# Patient Record
Sex: Male | Born: 1960 | Race: White | Hispanic: No | Marital: Married | Smoking: Never smoker
Health system: Southern US, Community
[De-identification: ages and names within clinical notes are randomized; demographics above are authoritative.]

## PROBLEM LIST (undated history)

## (undated) DIAGNOSIS — C801 Malignant (primary) neoplasm, unspecified: Secondary | ICD-10-CM

## (undated) HISTORY — PX: NO PAST SURGERIES: SHX2092

## (undated) HISTORY — PX: WRIST SURGERY: SHX841

---

## 2001-10-21 ENCOUNTER — Ambulatory Visit (HOSPITAL_COMMUNITY): Admission: RE | Admit: 2001-10-21 | Discharge: 2001-10-21 | Payer: Self-pay | Admitting: Chiropractic Medicine

## 2001-10-21 ENCOUNTER — Encounter: Payer: Self-pay | Admitting: Chiropractic Medicine

## 2003-07-19 ENCOUNTER — Ambulatory Visit (HOSPITAL_COMMUNITY): Admission: RE | Admit: 2003-07-19 | Discharge: 2003-07-19 | Payer: Self-pay | Admitting: Family Medicine

## 2003-09-14 ENCOUNTER — Ambulatory Visit (HOSPITAL_COMMUNITY): Admission: RE | Admit: 2003-09-14 | Discharge: 2003-09-14 | Payer: Self-pay | Admitting: Neurology

## 2016-02-22 ENCOUNTER — Other Ambulatory Visit (HOSPITAL_COMMUNITY): Payer: Self-pay | Admitting: Urology

## 2016-02-22 DIAGNOSIS — C61 Malignant neoplasm of prostate: Secondary | ICD-10-CM

## 2016-04-12 ENCOUNTER — Ambulatory Visit (HOSPITAL_COMMUNITY)
Admission: RE | Admit: 2016-04-12 | Discharge: 2016-04-12 | Disposition: A | Discharge status: Home / Self Care | Payer: BLUE CROSS/BLUE SHIELD | Source: Ambulatory Visit | Attending: Urology | Admitting: Urology

## 2016-04-12 DIAGNOSIS — C61 Malignant neoplasm of prostate: Secondary | ICD-10-CM | POA: Diagnosis present

## 2016-04-12 MED ORDER — GADOBENATE DIMEGLUMINE 529 MG/ML IV SOLN
20.0000 mL | Freq: Once | INTRAVENOUS | Status: AC | PRN
  Administered 2016-04-12: 17 mL via INTRAVENOUS

## 2016-06-28 DIAGNOSIS — C61 Malignant neoplasm of prostate: Secondary | ICD-10-CM | POA: Diagnosis not present

## 2016-07-22 DIAGNOSIS — M6281 Muscle weakness (generalized): Secondary | ICD-10-CM | POA: Diagnosis not present

## 2016-07-22 DIAGNOSIS — C61 Malignant neoplasm of prostate: Secondary | ICD-10-CM | POA: Diagnosis not present

## 2016-07-31 DIAGNOSIS — M6281 Muscle weakness (generalized): Secondary | ICD-10-CM | POA: Diagnosis not present

## 2016-07-31 DIAGNOSIS — M62838 Other muscle spasm: Secondary | ICD-10-CM | POA: Diagnosis not present

## 2016-08-05 ENCOUNTER — Other Ambulatory Visit: Payer: Self-pay | Admitting: Urology

## 2016-08-21 DIAGNOSIS — M62838 Other muscle spasm: Secondary | ICD-10-CM | POA: Diagnosis not present

## 2016-08-21 DIAGNOSIS — M6281 Muscle weakness (generalized): Secondary | ICD-10-CM | POA: Diagnosis not present

## 2016-09-02 DIAGNOSIS — Z0000 Encounter for general adult medical examination without abnormal findings: Secondary | ICD-10-CM | POA: Diagnosis not present

## 2016-10-02 DIAGNOSIS — C61 Malignant neoplasm of prostate: Secondary | ICD-10-CM | POA: Diagnosis not present

## 2016-10-02 DIAGNOSIS — M6281 Muscle weakness (generalized): Secondary | ICD-10-CM | POA: Diagnosis not present

## 2016-10-02 DIAGNOSIS — M62838 Other muscle spasm: Secondary | ICD-10-CM | POA: Diagnosis not present

## 2016-10-08 DIAGNOSIS — D1801 Hemangioma of skin and subcutaneous tissue: Secondary | ICD-10-CM | POA: Diagnosis not present

## 2016-10-08 DIAGNOSIS — D485 Neoplasm of uncertain behavior of skin: Secondary | ICD-10-CM | POA: Diagnosis not present

## 2016-10-08 DIAGNOSIS — Z85828 Personal history of other malignant neoplasm of skin: Secondary | ICD-10-CM | POA: Diagnosis not present

## 2016-10-08 DIAGNOSIS — C44112 Basal cell carcinoma of skin of right eyelid, including canthus: Secondary | ICD-10-CM | POA: Diagnosis not present

## 2016-10-08 NOTE — Patient Instructions (Addendum)
Joshua Lloyd  10/08/2016   Your procedure is scheduled on: 10/17/16  Report to [facility] Main  Entrance Take [place]  elevators to 3rd floor to  [place] at       [phone].     Call this number if you have problems the morning of surgery [phone]    Remember: ONLY 1 PERSON MAY GO WITH YOU TO SHORT STAY TO GET  READY MORNING OF YOUR SURGERY.     CLEAR LIQUID DIET   Day before surgery and a bottle of magnesium citrate by noon day before surgery, And a fleets enema night before surgery   Foods Allowed                                                                     Foods Excluded  Coffee and tea, regular and decaf                             liquids that you cannot  Plain Jell-O in any flavor                                             see through such as: Fruit ices (not with fruit pulp)                                     milk, soups, orange juice  Iced Popsicles                                    All solid food Carbonated beverages, regular and diet                                    Cranberry, grape and apple juices Sports drinks like Gatorade Lightly seasoned clear broth or consume(fat free) Sugar, honey syrup  Sample Menu Breakfast                                Lunch                                     Supper Cranberry juice                    Beef broth                            Chicken broth Jell-O                                     Grape  juice                           Apple juice Coffee or tea                        Jell-O                                      Popsicle                                                Coffee or tea                        Coffee or tea  _____________________________________________________________________      Do not drink liquids :After Midnight.     Take these medicines the morning of surgery with A SIP OF WATER:                                 You may not have any metal on your body  including hair pins and              piercings  Do not wear jewelry, lotions, powders or perfumes, deodorant                   Men may shave face and neck.   Do not bring valuables to the hospital. [place].  Contacts, dentures or bridgework may not be worn into surgery.  Leave suitcase in the car. After surgery it may be brought to your room.                  Please read over the following fact sheets you were given: _____________________________________________________________________             [facility] - Preparing for Surgery Before surgery, you can play an important role.  Because skin is not sterile, your skin needs to be as free of germs as possible.  You can reduce the number of germs on your skin by washing with CHG (chlorahexidine gluconate) soap before surgery.  CHG is an antiseptic cleaner which kills germs and bonds with the skin to continue killing germs even after washing. Please DO NOT use if you have an allergy to CHG or antibacterial soaps.  If your skin becomes reddened/irritated stop using the CHG and inform your nurse when you arrive at Short Stay. Do not shave (including legs and underarms) for at least 48 hours prior to the first CHG shower.  You may shave your face/neck. Please follow these instructions carefully:  1.  Shower with CHG Soap the night before surgery and the  morning of Surgery.  2.  If you choose to wash your hair, wash your hair first as usual with your  normal  shampoo.  3.  After you shampoo, rinse your hair and body thoroughly to remove the  shampoo.  4.  Use CHG as you would any other liquid soap.  You can apply chg directly  to the skin and wash                       Gently with a scrungie or clean washcloth.  5.  Apply the CHG Soap to your body ONLY FROM THE NECK DOWN.   Do not use on face/ open                           Wound or open sores. Avoid contact with eyes,  ears mouth and genitals (private parts).                       Wash face,  Genitals (private parts) with your normal soap.             6.  Wash thoroughly, paying special attention to the area where your surgery  will be performed.  7.  Thoroughly rinse your body with warm water from the neck down.  8.  DO NOT shower/wash with your normal soap after using and rinsing off  the CHG Soap.                9.  Pat yourself dry with a clean towel.            10.  Wear clean pajamas.            11.  Place clean sheets on your bed the night of your first shower and do not  sleep with pets. Day of Surgery : Do not apply any lotions/deodorants the morning of surgery.  Please wear clean clothes to the hospital/surgery center.  FAILURE TO FOLLOW THESE INSTRUCTIONS MAY RESULT IN THE CANCELLATION OF YOUR SURGERY PATIENT SIGNATURE_________________________________  NURSE SIGNATURE__________________________________  ________________________________________________________________________  WHAT IS A BLOOD TRANSFUSION? Blood Transfusion Information  A transfusion is the replacement of blood or some of its parts. Blood is made up of multiple cells which provide different functions.  Red blood cells carry oxygen and are used for blood loss replacement.  White blood cells fight against infection.  Platelets control bleeding.  Plasma helps clot blood.  Other blood products are available for specialized needs, such as hemophilia or other clotting disorders. BEFORE THE TRANSFUSION  Who gives blood for transfusions?   Healthy volunteers who are fully evaluated to make sure their blood is safe. This is blood bank blood. Transfusion therapy is the safest it has ever been in the practice of medicine. Before blood is taken from a donor, a complete history is taken to make sure that person has no history of diseases nor engages in risky social behavior (examples are intravenous drug use or sexual activity with  multiple partners). The donor's travel history is screened to minimize risk of transmitting infections, such as malaria. The donated blood is tested for signs of infectious diseases, such as HIV and hepatitis. The blood is then tested to be sure it is compatible with you in order to minimize the chance of a transfusion reaction. If you or a relative donates blood, this is often done in anticipation of surgery and is not appropriate for emergency situations. It takes many days to process the donated blood. RISKS AND COMPLICATIONS Although transfusion therapy is very safe and saves many lives, the main dangers of transfusion include:   Getting an infectious disease.  Developing a transfusion reaction.  This is an allergic reaction to something in the blood you were given. Every precaution is taken to prevent this. The decision to have a blood transfusion has been considered carefully by your caregiver before blood is given. Blood is not given unless the benefits outweigh the risks. AFTER THE TRANSFUSION  Right after receiving a blood transfusion, you will usually feel much better and more energetic. This is especially true if your red blood cells have gotten low (anemic). The transfusion raises the level of the red blood cells which carry oxygen, and this usually causes an energy increase.  The nurse administering the transfusion will monitor you carefully for complications. HOME CARE INSTRUCTIONS  No special instructions are needed after a transfusion. You may find your energy is better. Speak with your caregiver about any limitations on activity for underlying diseases you may have. SEEK MEDICAL CARE IF:   Your condition is not improving after your transfusion.  You develop redness or irritation at the intravenous (IV) site. SEEK IMMEDIATE MEDICAL CARE IF:  Any of the following symptoms occur over the next 12 hours:  Shaking chills.  You have a temperature by mouth above 102 F (38.9 C), not  controlled by medicine.  Chest, back, or muscle pain.  People around you feel you are not acting correctly or are confused.  Shortness of breath or difficulty breathing.  Dizziness and fainting.  You get a rash or develop hives.  You have a decrease in urine output.  Your urine turns a dark color or changes to pink, red, or brown. Any of the following symptoms occur over the next 10 days:  You have a temperature by mouth above 102 F (38.9 C), not controlled by medicine.  Shortness of breath.  Weakness after normal activity.  The white part of the eye turns yellow (jaundice).  You have a decrease in the amount of urine or are urinating less often.  Your urine turns a dark color or changes to pink, red, or brown. Document Released: 06/07/2000 Document Revised: 09/02/2011 Document Reviewed: 01/25/2008 ExitCare Patient Information 2014 [place].  _______________________________________________________________________  Incentive Spirometer  An incentive spirometer is a tool that can help keep your lungs clear and active. This tool measures how well you are filling your lungs with each breath. Taking long deep breaths may help reverse or decrease the chance of developing breathing (pulmonary) problems (especially infection) following:  A long period of time when you are unable to move or be active. BEFORE THE PROCEDURE   If the spirometer includes an indicator to show your best effort, your nurse or respiratory therapist will set it to a desired goal.  If possible, sit up straight or lean slightly forward. Try not to slouch.  Hold the incentive spirometer in an upright position. INSTRUCTIONS FOR USE  1. Sit on the edge of your bed if possible, or sit up as far as you can in bed or on a chair. 2. Hold the incentive spirometer in an upright position. 3. Breathe out normally. 4. Place the mouthpiece in your mouth and seal your lips tightly around it. 5. Breathe in  slowly and as deeply as possible, raising the piston or the ball toward the top of the column. 6. Hold your breath for 3-5 seconds or for as long as possible. Allow the piston or ball to fall to the bottom of the column. 7. Remove the mouthpiece from your mouth and breathe out normally. 8. Rest for a few seconds and repeat Steps  1 through 7 at least 10 times every 1-2 hours when you are awake. Take your time and take a few normal breaths between deep breaths. 9. The spirometer may include an indicator to show your best effort. Use the indicator as a goal to work toward during each repetition. 10. After each set of 10 deep breaths, practice coughing to be sure your lungs are clear. If you have an incision (the cut made at the time of surgery), support your incision when coughing by placing a pillow or rolled up towels firmly against it. Once you are able to get out of bed, walk around indoors and cough well. You may stop using the incentive spirometer when instructed by your caregiver.  RISKS AND COMPLICATIONS  Take your time so you do not get dizzy or light-headed.  If you are in pain, you may need to take or ask for pain medication before doing incentive spirometry. It is harder to take a deep breath if you are having pain. AFTER USE  Rest and breathe slowly and easily.  It can be helpful to keep track of a log of your progress. Your caregiver can provide you with a simple table to help with this. If you are using the spirometer at home, follow these instructions: [place] IF:   You are having difficultly using the spirometer.  You have trouble using the spirometer as often as instructed.  Your pain medication is not giving enough relief while using the spirometer.  You develop fever of 100.5 F (38.1 C) or higher. SEEK IMMEDIATE MEDICAL CARE IF:   You cough up bloody sputum that had not been present before.  You develop fever of 102 F (38.9 C) or greater.  You develop  worsening pain at or near the incision site. MAKE SURE YOU:   Understand these instructions.  Will watch your condition.  Will get help right away if you are not doing well or get worse. Document Released: 10/21/2006 Document Revised: 09/02/2011 Document Reviewed: 12/22/2006 [facility] Patient Information 2014 [place], [place].   ________________________________________________________________________

## 2016-10-09 ENCOUNTER — Ambulatory Visit (HOSPITAL_COMMUNITY)
Admission: RE | Admit: 2016-10-09 | Discharge: 2016-10-09 | Disposition: A | Discharge status: Home / Self Care | Payer: 59 | Source: Ambulatory Visit | Attending: Urology | Admitting: Urology

## 2016-10-09 ENCOUNTER — Encounter (HOSPITAL_COMMUNITY)
Admission: RE | Admit: 2016-10-09 | Discharge: 2016-10-09 | Disposition: A | Discharge status: Home / Self Care | Payer: 59 | Source: Ambulatory Visit | Attending: Urology | Admitting: Urology

## 2016-10-09 ENCOUNTER — Encounter (HOSPITAL_COMMUNITY): Payer: Self-pay

## 2016-10-09 DIAGNOSIS — Z01812 Encounter for preprocedural laboratory examination: Secondary | ICD-10-CM | POA: Insufficient documentation

## 2016-10-09 DIAGNOSIS — Z01818 Encounter for other preprocedural examination: Secondary | ICD-10-CM | POA: Insufficient documentation

## 2016-10-09 DIAGNOSIS — Z01810 Encounter for preprocedural cardiovascular examination: Secondary | ICD-10-CM | POA: Diagnosis not present

## 2016-10-09 DIAGNOSIS — C61 Malignant neoplasm of prostate: Secondary | ICD-10-CM | POA: Diagnosis not present

## 2016-10-09 DIAGNOSIS — Z0389 Encounter for observation for other suspected diseases and conditions ruled out: Secondary | ICD-10-CM | POA: Diagnosis not present

## 2016-10-09 HISTORY — DX: Malignant (primary) neoplasm, unspecified: C80.1

## 2016-10-09 LAB — BASIC METABOLIC PANEL
Anion gap: 8 (ref 5–15)
BUN: 16 mg/dL (ref 6–20)
CHLORIDE: 103 mmol/L (ref 101–111)
CO2: 27 mmol/L (ref 22–32)
CREATININE: 1 mg/dL (ref 0.61–1.24)
Calcium: 9.5 mg/dL (ref 8.9–10.3)
Glucose, Bld: 91 mg/dL (ref 65–99)
POTASSIUM: 4.4 mmol/L (ref 3.5–5.1)
SODIUM: 138 mmol/L (ref 135–145)

## 2016-10-09 LAB — CBC
HEMATOCRIT: 43.1 % (ref 39.0–52.0)
Hemoglobin: 14.8 g/dL (ref 13.0–17.0)
MCH: 33 pg (ref 26.0–34.0)
MCHC: 34.3 g/dL (ref 30.0–36.0)
MCV: 96.2 fL (ref 78.0–100.0)
Platelets: 193 10*3/uL (ref 150–400)
RBC: 4.48 MIL/uL (ref 4.22–5.81)
RDW: 12.6 % (ref 11.5–15.5)
WBC: 6.5 10*3/uL (ref 4.0–10.5)

## 2016-10-09 LAB — ABO/RH: ABO/RH(D): A POS

## 2016-10-16 NOTE — H&P (Signed)
Office Visit Report     10/02/2016   --------------------------------------------------------------------------------   Joshua Lloyd  MRN: [id number]  PRIMARY CARE:  Delanna Ahmadi, MD  DOB: 1961/05/28, 56 year old Male  REFERRING:  Raynelle Bring, MD  SSN: -[id number]  PROVIDER:  Raynelle Bring, M.D.    LOCATION:  [facility] [phone]   --------------------------------------------------------------------------------   CC/HPI: Prostate cancer   Bary returns today in preparation for his upcoming radical prostatectomy. He has had no changes in his overall health over the past few months since he was last seen. He feels comfortable proceeding with surgical therapy as planned.     ALLERGIES: PredniSONE TABS    MEDICATIONS: Diazepam 10 mg tablet Take one tablet 30-60 minutes prior to MRI  LevoFLOXacin 500 MG Oral Tablet 0 Oral     GU PSH: Prostate Needle Biopsy - 06/04/2016      PSH Notes: Wrist Surgery   NON-GU PSH: Surgical Pathology, Gross And Microscopic Examination For Prostate Needle - 06/04/2016    GU PMH: Stress Incontinence, M/F - 10/02/2016, - 08/21/2016, - 07/31/2016 Prostate Cancer, Prostate cancer - 08/31/2015      PMH Notes:   1) Prostate cancer: He had undergone annual PSA screening due to his family history of prostate cancer. He was noted to have a rising PSA of 3.01 at age 30 prompting a TRUS biopsy of the prostate on 08/24/15. This demonstrated Gleason 3+3=6 adenocarcinoma of the prostate in 10% of one biopsy core. His father had prostate cancer diagnosed at age 38 and underwent treatment with radical prostatectomy.   Initial diagnosis: March 2017  TNM stage: cT1c Nx Mx  PSA at diagnosis: 3.01  Gleason score: 3+3=6  Biopsy (08/24/15): 1/12 biopsy cores - L lateral base (10%, 3+3=6)  Prostate volume: 29.1 cc  PSAD: 0.10   Baseline urinary function: Griffith has mild to moderate lower urinary tract symptoms at baseline. IPSS is 10.  Baseline  erectile function: He denies erectile dysfunction. SHIM score is 24.   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   3) S/P vasectomy in 1999     NON-GU PMH: Muscle weakness (generalized) - 10/02/2016, - 08/21/2016, - 07/31/2016, - 07/22/2016 Other muscle spasm - 10/02/2016, - 08/21/2016, - 07/31/2016 Personal history of diseases of the skin and subcutaneous tissue, History of psoriasis - 08/31/2015    FAMILY HISTORY: Prostate Cancer - Runs In Family   SOCIAL HISTORY: Marital Status: Married Current Smoking Status: Patient has never smoked.   Tobacco Use Assessment Completed: Used Tobacco in last 30 days? Does drink.  Drinks 1 caffeinated drink per day.     Notes: Never A Smoker, Tobacco Use, Alcohol Use, Marital History - Currently Married, Occupation:   REVIEW OF SYSTEMS:    GU Review Male:   Patient denies frequent urination, hard to postpone urination, burning/ pain with urination, get up at night to urinate, leakage of urine, stream starts and stops, trouble starting your streams, and have to strain to urinate .  Gastrointestinal (Upper):   Patient denies nausea and vomiting.  Gastrointestinal (Lower):   Patient denies diarrhea and constipation.  Constitutional:   Patient denies fever, night sweats, weight loss, and fatigue.  Skin:   Patient denies skin rash/ lesion and itching.  Eyes:   Patient denies blurred vision and double vision.  Ears/ Nose/ Throat:   Patient denies sore throat and sinus problems.  Hematologic/Lymphatic:   Patient  denies swollen glands and easy bruising.  Cardiovascular:   Patient denies leg swelling and chest pains.  Respiratory:   Patient denies cough and shortness of breath.  Endocrine:   Patient denies excessive thirst.  Musculoskeletal:   Patient denies back pain and joint pain.  Neurological:   Patient denies headaches and dizziness.  Psychologic:   Patient denies depression and anxiety.    VITAL SIGNS:      10/02/2016 12:39 PM  Weight 186 lb / 84.37 kg  Height 74 in / 187.96 cm  BP 137/96 mmHg  Pulse 65 /min  BMI 23.9 kg/m   MULTI-SYSTEM PHYSICAL EXAMINATION:    Constitutional: Well-nourished. No physical deformities. Normally developed. Good grooming.  Neck: Neck symmetrical, not swollen. Normal tracheal position.  Respiratory: No labored breathing, no use of accessory muscles. Clear bilaterally.  Cardiovascular: Normal temperature, normal extremity pulses, no swelling, no varicosities. Regular rate and rhythm.  Gastrointestinal: No mass, no tenderness, no rigidity, non obese abdomen.      PAST DATA REVIEWED:  Source Of History:  Patient  Lab Test Review:   PSA  Records Review:   Pathology Reports, Previous Patient Records  Urine Test Review:   Urinalysis   04/12/16 07/20/15 07/08/14 07/02/13 07/03/12 06/05/11 05/14/10 10/18/09  PSA  Total PSA 3.09 ng/dl 3.01  2.64  2.43  2.03  2.20  1.72  2.00     PROCEDURES:          Urinalysis Dipstick Dipstick Cont'd  Color: Yellow Bilirubin: Neg  Appearance: Clear Ketones: Neg  Specific Gravity: 1.015 Blood: Neg  pH: <=5.0 Protein: Neg  Glucose: Neg Urobilinogen: 0.2    Nitrites: Neg    Leukocyte Esterase: Neg    ASSESSMENT:      ICD-10 Details  1 GU:   Prostate Cancer - C61    PLAN:           Schedule Return Visit/Planned Activity: Keep Scheduled Appointment          Document Letter(s):  Created for Patient: Clinical Summary         Notes:   1. Prostate cancer: He is scheduled to proceed with a bilateral nerve sparing robot-assisted laparoscopic radical prostatectomy and pelvic lymphadenectomy. He feels comfortable going forward and all his questions have been answered to his stated satisfaction. We have reviewed expectations with regard to cancer control, urinary function, and erectile function. We also have reviewed the expected recovery process.   Cc: Dr. Lavone Orn    E & M CODE: I spent at  least 25 minutes face to face with the patient, more than 50% of that time was spent on counseling and/or coordinating care.     * Signed by Raynelle Bring, M.D. on 10/02/16 at 6:58 PM (EDT)*

## 2016-10-17 ENCOUNTER — Encounter (HOSPITAL_COMMUNITY)
Admission: RE | Disposition: A | Discharge status: Home / Self Care | Payer: Self-pay | Source: Ambulatory Visit | Attending: Urology

## 2016-10-17 ENCOUNTER — Ambulatory Visit (HOSPITAL_COMMUNITY): Payer: 59 | Admitting: Anesthesiology

## 2016-10-17 ENCOUNTER — Encounter (HOSPITAL_COMMUNITY): Payer: Self-pay | Admitting: *Deleted

## 2016-10-17 ENCOUNTER — Observation Stay (HOSPITAL_COMMUNITY)
Admission: RE | Admit: 2016-10-17 | Discharge: 2016-10-18 | Disposition: A | Discharge status: Home / Self Care | Payer: 59 | Source: Ambulatory Visit | Attending: Urology | Admitting: Urology

## 2016-10-17 DIAGNOSIS — Z8042 Family history of malignant neoplasm of prostate: Secondary | ICD-10-CM | POA: Diagnosis not present

## 2016-10-17 DIAGNOSIS — Z9889 Other specified postprocedural states: Secondary | ICD-10-CM | POA: Insufficient documentation

## 2016-10-17 DIAGNOSIS — Z79899 Other long term (current) drug therapy: Secondary | ICD-10-CM | POA: Insufficient documentation

## 2016-10-17 DIAGNOSIS — C61 Malignant neoplasm of prostate: Principal | ICD-10-CM | POA: Diagnosis present

## 2016-10-17 DIAGNOSIS — Z9852 Vasectomy status: Secondary | ICD-10-CM | POA: Diagnosis not present

## 2016-10-17 DIAGNOSIS — L409 Psoriasis, unspecified: Secondary | ICD-10-CM | POA: Insufficient documentation

## 2016-10-17 DIAGNOSIS — Z888 Allergy status to other drugs, medicaments and biological substances status: Secondary | ICD-10-CM | POA: Diagnosis not present

## 2016-10-17 HISTORY — PX: ROBOT ASSISTED LAPAROSCOPIC RADICAL PROSTATECTOMY: SHX5141

## 2016-10-17 LAB — HEMOGLOBIN AND HEMATOCRIT, BLOOD
HCT: 41.9 % (ref 39.0–52.0)
HEMOGLOBIN: 14.1 g/dL (ref 13.0–17.0)

## 2016-10-17 LAB — TYPE AND SCREEN
ABO/RH(D): A POS
Antibody Screen: NEGATIVE

## 2016-10-17 SURGERY — ROBOTIC ASSISTED LAPAROSCOPIC RADICAL PROSTATECTOMY LEVEL 1
Anesthesia: General

## 2016-10-17 MED ORDER — HYDROMORPHONE HCL 2 MG/ML IJ SOLN
INTRAMUSCULAR | Status: AC
  Filled 2016-10-17: qty 1

## 2016-10-17 MED ORDER — DIPHENHYDRAMINE HCL 50 MG/ML IJ SOLN: 12.5000 mg | Freq: Four times a day (QID) | INTRAMUSCULAR | Status: DC | PRN

## 2016-10-17 MED ORDER — ONDANSETRON HCL 4 MG/2ML IJ SOLN
4.0000 mg | INTRAMUSCULAR | Status: DC | PRN
  Filled 2016-10-17: qty 2

## 2016-10-17 MED ORDER — CEFAZOLIN SODIUM-DEXTROSE 2-4 GM/100ML-% IV SOLN
2.0000 g | INTRAVENOUS | Status: AC
  Administered 2016-10-17: 2 g via INTRAVENOUS

## 2016-10-17 MED ORDER — LIDOCAINE 2% (20 MG/ML) 5 ML SYRINGE
INTRAMUSCULAR | Status: AC
  Filled 2016-10-17: qty 5

## 2016-10-17 MED ORDER — METHOCARBAMOL 1000 MG/10ML IJ SOLN
750.0000 mg | Freq: Once | INTRAVENOUS | Status: AC
  Administered 2016-10-17: 750 mg via INTRAVENOUS
  Filled 2016-10-17: qty 7.5

## 2016-10-17 MED ORDER — LACTATED RINGERS IV SOLN
INTRAVENOUS | Status: DC | PRN
  Administered 2016-10-17 (×2): via INTRAVENOUS

## 2016-10-17 MED ORDER — METOCLOPRAMIDE HCL 5 MG/ML IJ SOLN
INTRAMUSCULAR | Status: DC | PRN
  Administered 2016-10-17: 10 mg via INTRAVENOUS

## 2016-10-17 MED ORDER — METOCLOPRAMIDE HCL 5 MG/ML IJ SOLN
INTRAMUSCULAR | Status: AC
  Filled 2016-10-17: qty 2

## 2016-10-17 MED ORDER — SENNA 8.6 MG PO TABS
1.0000 | ORAL_TABLET | Freq: Two times a day (BID) | ORAL | Status: DC
  Administered 2016-10-17 – 2016-10-18 (×2): 8.6 mg via ORAL
  Filled 2016-10-17 (×2): qty 1

## 2016-10-17 MED ORDER — FENTANYL CITRATE (PF) 250 MCG/5ML IJ SOLN
INTRAMUSCULAR | Status: AC
  Filled 2016-10-17: qty 5

## 2016-10-17 MED ORDER — MIDAZOLAM HCL 5 MG/5ML IJ SOLN
INTRAMUSCULAR | Status: DC | PRN
  Administered 2016-10-17: 2 mg via INTRAVENOUS

## 2016-10-17 MED ORDER — SUCCINYLCHOLINE CHLORIDE 200 MG/10ML IV SOSY
PREFILLED_SYRINGE | INTRAVENOUS | Status: AC
  Filled 2016-10-17: qty 10

## 2016-10-17 MED ORDER — ROCURONIUM BROMIDE 50 MG/5ML IV SOSY
PREFILLED_SYRINGE | INTRAVENOUS | Status: AC
  Filled 2016-10-17: qty 5

## 2016-10-17 MED ORDER — LIDOCAINE 2% (20 MG/ML) 5 ML SYRINGE
INTRAMUSCULAR | Status: DC | PRN
  Administered 2016-10-17: 100 mg via INTRAVENOUS

## 2016-10-17 MED ORDER — HYDROMORPHONE HCL 1 MG/ML IJ SOLN
INTRAMUSCULAR | Status: DC | PRN
  Administered 2016-10-17: 0.5 mg via INTRAVENOUS
  Administered 2016-10-17: 1 mg via INTRAVENOUS
  Administered 2016-10-17: 0.5 mg via INTRAVENOUS

## 2016-10-17 MED ORDER — KETOROLAC TROMETHAMINE 15 MG/ML IJ SOLN
15.0000 mg | Freq: Four times a day (QID) | INTRAMUSCULAR | Status: DC
  Administered 2016-10-17 – 2016-10-18 (×5): 15 mg via INTRAVENOUS
  Filled 2016-10-17 (×6): qty 1

## 2016-10-17 MED ORDER — ROCURONIUM BROMIDE 10 MG/ML (PF) SYRINGE
PREFILLED_SYRINGE | INTRAVENOUS | Status: DC | PRN
  Administered 2016-10-17: 10 mg via INTRAVENOUS
  Administered 2016-10-17 (×2): 5 mg via INTRAVENOUS
  Administered 2016-10-17: 50 mg via INTRAVENOUS
  Administered 2016-10-17: 10 mg via INTRAVENOUS
  Administered 2016-10-17: 20 mg via INTRAVENOUS
  Administered 2016-10-17 (×2): 10 mg via INTRAVENOUS

## 2016-10-17 MED ORDER — LACTATED RINGERS IV SOLN
INTRAVENOUS | Status: DC | PRN
  Administered 2016-10-17: 08:00:00

## 2016-10-17 MED ORDER — DOCUSATE SODIUM 100 MG PO CAPS
100.0000 mg | ORAL_CAPSULE | Freq: Two times a day (BID) | ORAL | Status: DC
  Administered 2016-10-17 – 2016-10-18 (×2): 100 mg via ORAL
  Filled 2016-10-17 (×2): qty 1

## 2016-10-17 MED ORDER — SODIUM CHLORIDE 0.9 % IR SOLN
Status: DC | PRN
  Administered 2016-10-17: 1000 mL

## 2016-10-17 MED ORDER — SUGAMMADEX SODIUM 200 MG/2ML IV SOLN
INTRAVENOUS | Status: DC | PRN
  Administered 2016-10-17: 200 mg via INTRAVENOUS

## 2016-10-17 MED ORDER — GLYCOPYRROLATE 0.2 MG/ML IV SOSY
PREFILLED_SYRINGE | INTRAVENOUS | Status: DC | PRN
  Administered 2016-10-17: .2 mg via INTRAVENOUS

## 2016-10-17 MED ORDER — PROPOFOL 10 MG/ML IV BOLUS
INTRAVENOUS | Status: DC | PRN
  Administered 2016-10-17: 180 mg via INTRAVENOUS

## 2016-10-17 MED ORDER — OXYCODONE HCL 5 MG PO TABS
5.0000 mg | ORAL_TABLET | ORAL | Status: DC | PRN
  Administered 2016-10-17 – 2016-10-18 (×4): 5 mg via ORAL
  Filled 2016-10-17 (×4): qty 1

## 2016-10-17 MED ORDER — CEFAZOLIN SODIUM-DEXTROSE 2-4 GM/100ML-% IV SOLN
INTRAVENOUS | Status: AC
  Filled 2016-10-17: qty 100

## 2016-10-17 MED ORDER — MIDAZOLAM HCL 2 MG/2ML IJ SOLN
INTRAMUSCULAR | Status: AC
  Filled 2016-10-17: qty 2

## 2016-10-17 MED ORDER — PROPOFOL 10 MG/ML IV BOLUS
INTRAVENOUS | Status: AC
  Filled 2016-10-17: qty 40

## 2016-10-17 MED ORDER — HYDROMORPHONE HCL 1 MG/ML IJ SOLN
0.5000 mg | INTRAMUSCULAR | Status: DC | PRN
  Administered 2016-10-17 (×2): 1 mg via INTRAVENOUS
  Filled 2016-10-17 (×2): qty 1

## 2016-10-17 MED ORDER — ONDANSETRON HCL 4 MG/2ML IJ SOLN
INTRAMUSCULAR | Status: DC | PRN
  Administered 2016-10-17 (×2): 4 mg via INTRAVENOUS

## 2016-10-17 MED ORDER — ACETAMINOPHEN 325 MG PO TABS
650.0000 mg | ORAL_TABLET | Freq: Four times a day (QID) | ORAL | Status: DC
  Administered 2016-10-17 – 2016-10-18 (×5): 650 mg via ORAL
  Filled 2016-10-17 (×5): qty 2

## 2016-10-17 MED ORDER — PROMETHAZINE HCL 25 MG/ML IJ SOLN
6.2500 mg | INTRAMUSCULAR | Status: DC | PRN
Start: 2016-10-17 — End: 2016-10-17

## 2016-10-17 MED ORDER — HEPARIN SODIUM (PORCINE) 1000 UNIT/ML IJ SOLN
INTRAMUSCULAR | Status: AC
  Filled 2016-10-17: qty 1

## 2016-10-17 MED ORDER — FENTANYL CITRATE (PF) 100 MCG/2ML IJ SOLN
INTRAMUSCULAR | Status: DC | PRN
Start: 2016-10-17 — End: 2016-10-17
  Administered 2016-10-17: 50 ug via INTRAVENOUS
  Administered 2016-10-17: 150 ug via INTRAVENOUS
  Administered 2016-10-17: 50 ug via INTRAVENOUS

## 2016-10-17 MED ORDER — BUPIVACAINE-EPINEPHRINE 0.25% -1:200000 IJ SOLN
INTRAMUSCULAR | Status: DC | PRN
  Administered 2016-10-17: 30 mL

## 2016-10-17 MED ORDER — CEFAZOLIN SODIUM-DEXTROSE 1-4 GM/50ML-% IV SOLN
1.0000 g | Freq: Three times a day (TID) | INTRAVENOUS | Status: AC
  Administered 2016-10-17 (×2): 1 g via INTRAVENOUS
  Filled 2016-10-17 (×2): qty 50

## 2016-10-17 MED ORDER — SUGAMMADEX SODIUM 200 MG/2ML IV SOLN
INTRAVENOUS | Status: AC
  Filled 2016-10-17: qty 8

## 2016-10-17 MED ORDER — HYDROMORPHONE HCL 1 MG/ML IJ SOLN
INTRAMUSCULAR | Status: DC
Start: 2016-10-17 — End: 2016-10-17
  Filled 2016-10-17: qty 1

## 2016-10-17 MED ORDER — GLYCOPYRROLATE 0.2 MG/ML IV SOSY
PREFILLED_SYRINGE | INTRAVENOUS | Status: AC
  Filled 2016-10-17: qty 5

## 2016-10-17 MED ORDER — DIPHENHYDRAMINE HCL 12.5 MG/5ML PO ELIX: 12.5000 mg | ORAL_SOLUTION | Freq: Four times a day (QID) | ORAL | Status: DC | PRN

## 2016-10-17 MED ORDER — KCL IN DEXTROSE-NACL 20-5-0.45 MEQ/L-%-% IV SOLN
INTRAVENOUS | Status: DC
  Administered 2016-10-17 – 2016-10-18 (×3): via INTRAVENOUS
  Filled 2016-10-17 (×3): qty 1000

## 2016-10-17 MED ORDER — ROCURONIUM BROMIDE 50 MG/5ML IV SOSY
PREFILLED_SYRINGE | INTRAVENOUS | Status: AC
  Filled 2016-10-17: qty 10

## 2016-10-17 MED ORDER — MEPERIDINE HCL 50 MG/ML IJ SOLN: 6.2500 mg | INTRAMUSCULAR | Status: DC | PRN

## 2016-10-17 MED ORDER — HYDROMORPHONE HCL 1 MG/ML IJ SOLN
0.2500 mg | INTRAMUSCULAR | Status: DC | PRN
  Administered 2016-10-17 (×2): 0.5 mg via INTRAVENOUS

## 2016-10-17 MED ORDER — BUPIVACAINE HCL (PF) 0.25 % IJ SOLN
INTRAMUSCULAR | Status: AC
  Filled 2016-10-17: qty 30

## 2016-10-17 MED ORDER — HYDROMORPHONE HCL 1 MG/ML IJ SOLN
INTRAMUSCULAR | Status: AC
  Administered 2016-10-17: 0.5 mg via INTRAVENOUS
  Filled 2016-10-17: qty 2

## 2016-10-17 MED ORDER — SODIUM CHLORIDE 0.9 % IV BOLUS (SEPSIS)
1000.0000 mL | Freq: Once | INTRAVENOUS | Status: AC
  Administered 2016-10-17: 1000 mL via INTRAVENOUS

## 2016-10-17 MED ORDER — OXYBUTYNIN CHLORIDE 5 MG PO TABS: 5.0000 mg | ORAL_TABLET | Freq: Three times a day (TID) | ORAL | Status: DC | PRN

## 2016-10-17 MED ORDER — ONDANSETRON HCL 4 MG/2ML IJ SOLN
INTRAMUSCULAR | Status: AC
  Filled 2016-10-17: qty 4

## 2016-10-17 SURGICAL SUPPLY — 57 items
ADH SKN CLS APL DERMABOND .7 (GAUZE/BANDAGES/DRESSINGS) ×1
APPLICATOR COTTON TIP 6IN STRL (MISCELLANEOUS) ×2 IMPLANT
CATH FOLEY 2WAY SLVR 18FR 30CC (CATHETERS) ×2 IMPLANT
CATH ROBINSON RED A/P 16FR (CATHETERS) ×2 IMPLANT
CATH ROBINSON RED A/P 8FR (CATHETERS) ×2 IMPLANT
CATH TIEMANN FOLEY 18FR 5CC (CATHETERS) ×2 IMPLANT
CHLORAPREP W/TINT 26ML (MISCELLANEOUS) ×2 IMPLANT
CLIP LIGATING HEM O LOK PURPLE (MISCELLANEOUS) ×4 IMPLANT
COVER SURGICAL LIGHT HANDLE (MISCELLANEOUS) ×2 IMPLANT
COVER TIP SHEARS 8 DVNC (MISCELLANEOUS) ×1 IMPLANT
COVER TIP SHEARS 8MM DA VINCI (MISCELLANEOUS) ×1
CUTTER ECHEON FLEX ENDO 45 340 (ENDOMECHANICALS) ×2 IMPLANT
DECANTER SPIKE VIAL GLASS SM (MISCELLANEOUS) ×1 IMPLANT
DERMABOND ADVANCED (GAUZE/BANDAGES/DRESSINGS) ×1
DERMABOND ADVANCED .7 DNX12 (GAUZE/BANDAGES/DRESSINGS) IMPLANT
DRAPE ARM DVNC X/XI (DISPOSABLE) ×4 IMPLANT
DRAPE COLUMN DVNC XI (DISPOSABLE) ×1 IMPLANT
DRAPE DA VINCI XI ARM (DISPOSABLE) ×4
DRAPE DA VINCI XI COLUMN (DISPOSABLE) ×1
DRAPE SURG IRRIG POUCH 19X23 (DRAPES) ×2 IMPLANT
DRSG TEGADERM 4X4.75 (GAUZE/BANDAGES/DRESSINGS) ×3 IMPLANT
ELECT REM PT RETURN 15FT ADLT (MISCELLANEOUS) ×2 IMPLANT
GAUZE SPONGE 2X2 8PLY STRL LF (GAUZE/BANDAGES/DRESSINGS) IMPLANT
GLOVE BIO SURGEON STRL SZ 6.5 (GLOVE) ×3 IMPLANT
GLOVE BIOGEL M STRL SZ7.5 (GLOVE) ×6 IMPLANT
GOWN STRL REUS W/TWL LRG LVL3 (GOWN DISPOSABLE) ×9 IMPLANT
HOLDER FOLEY CATH W/STRAP (MISCELLANEOUS) ×2 IMPLANT
IRRIG SUCT STRYKERFLOW 2 WTIP (MISCELLANEOUS) ×2
IRRIGATION SUCT STRKRFLW 2 WTP (MISCELLANEOUS) ×1 IMPLANT
IV LACTATED RINGERS 1000ML (IV SOLUTION) ×1 IMPLANT
NDL SAFETY ECLIPSE 18X1.5 (NEEDLE) ×1 IMPLANT
NEEDLE HYPO 18GX1.5 SHARP (NEEDLE) ×2
PACK ROBOT UROLOGY CUSTOM (CUSTOM PROCEDURE TRAY) ×2 IMPLANT
RELOAD STAPLE 45 4.1 GRN THCK (STAPLE) IMPLANT
SEAL CANN UNIV 5-8 DVNC XI (MISCELLANEOUS) ×4 IMPLANT
SEAL XI 5MM-8MM UNIVERSAL (MISCELLANEOUS) ×4
SOLUTION ELECTROLUBE (MISCELLANEOUS) ×2 IMPLANT
SPONGE GAUZE 2X2 STER 10/PKG (GAUZE/BANDAGES/DRESSINGS) ×1
STAPLE RELOAD 45 GRN (STAPLE) ×1 IMPLANT
STAPLE RELOAD 45MM GREEN (STAPLE) ×2
SUT ETHILON 3 0 PS 1 (SUTURE) ×2 IMPLANT
SUT MNCRL 3 0 RB1 (SUTURE) ×1 IMPLANT
SUT MNCRL 3 0 VIOLET RB1 (SUTURE) ×1 IMPLANT
SUT MNCRL AB 4-0 PS2 18 (SUTURE) ×4 IMPLANT
SUT MONOCRYL 3 0 RB1 (SUTURE) ×2
SUT VIC AB 0 CT1 27 (SUTURE) ×2
SUT VIC AB 0 CT1 27XBRD ANTBC (SUTURE) ×1 IMPLANT
SUT VIC AB 0 UR5 27 (SUTURE) ×2 IMPLANT
SUT VIC AB 2-0 SH 27 (SUTURE) ×4
SUT VIC AB 2-0 SH 27X BRD (SUTURE) ×1 IMPLANT
SUT VIC AB 3-0 SH 27 (SUTURE) ×2
SUT VIC AB 3-0 SH 27X BRD (SUTURE) IMPLANT
SUT VICRYL 0 UR6 27IN ABS (SUTURE) ×4 IMPLANT
SYR 27GX1/2 1ML LL SAFETY (SYRINGE) ×2 IMPLANT
TOWEL OR 17X26 10 PK STRL BLUE (TOWEL DISPOSABLE) ×2 IMPLANT
TOWEL OR NON WOVEN STRL DISP B (DISPOSABLE) ×2 IMPLANT
WATER STERILE IRR 1500ML POUR (IV SOLUTION) ×2 IMPLANT

## 2016-10-17 NOTE — Transfer of Care (Signed)
Immediate Anesthesia Transfer of Care Note  Patient: Joshua Lloyd  Procedure(s) Performed: Procedure(s): ROBOTIC ASSISTED LAPAROSCOPIC RADICAL PROSTATECTOMY LEVEL 2/ BILATERAL PELVIC LYMPHADENECTOMY (N/A)  Patient Location: PACU  Anesthesia Type:General  Level of Consciousness:  sedated, patient cooperative and responds to stimulation  Airway & Oxygen Therapy:Patient Spontanous Breathing and Patient connected to face mask oxgen  Post-op Assessment:  Report given to PACU RN and Post -op Vital signs reviewed and stable  Post vital signs:  Reviewed and stable  Last Vitals:  Vitals:   10/17/16 0519  BP: 110/73  Pulse: 73  Resp: 18  Temp: 16.1 C    Complications: No apparent anesthesia complications

## 2016-10-17 NOTE — Anesthesia Postprocedure Evaluation (Signed)
Anesthesia Post Note  Patient: Joshua Lloyd  Procedure(s) Performed: Procedure(s) (LRB): ROBOTIC ASSISTED LAPAROSCOPIC RADICAL PROSTATECTOMY LEVEL 2/ BILATERAL PELVIC LYMPHADENECTOMY (N/A)  Patient location during evaluation: PACU Anesthesia Type: General Level of consciousness: awake and alert Pain management: pain level controlled Vital Signs Assessment: post-procedure vital signs reviewed and stable Respiratory status: spontaneous breathing, nonlabored ventilation and respiratory function stable Cardiovascular status: blood pressure returned to baseline and stable Postop Assessment: no signs of nausea or vomiting Anesthetic complications: no       Last Vitals:  Vitals:   10/17/16 1200 10/17/16 1227  BP: (!) 141/73 130/74  Pulse: 87 93  Resp: 11 16  Temp: 36.7 C 36.7 C    Last Pain:  Vitals:   10/17/16 1200  TempSrc:   PainSc: 0-No pain                 Leshae Mcclay A.

## 2016-10-17 NOTE — Anesthesia Preprocedure Evaluation (Signed)
Anesthesia Evaluation  Patient identified by MRN, date of birth, ID band Patient awake    Reviewed: Allergy & Precautions, NPO status , Patient's Chart, lab work & pertinent test results  Airway Mallampati: II  TM Distance: >3 FB Neck ROM: Full    Dental no notable dental hx. (+) Teeth Intact   Pulmonary neg pulmonary ROS,    Pulmonary exam normal breath sounds clear to auscultation       Cardiovascular negative cardio ROS Normal cardiovascular exam Rhythm:Regular Rate:Normal     Neuro/Psych negative neurological ROS  negative psych ROS   GI/Hepatic negative GI ROS, Neg liver ROS,   Endo/Other  negative endocrine ROS  Renal/GU negative Renal ROS   Prostate Ca    Musculoskeletal negative musculoskeletal ROS (+)   Abdominal   Peds  Hematology negative hematology ROS (+)   Anesthesia Other Findings   Reproductive/Obstetrics negative OB ROS                             Lab Results  Component Value Date   WBC 6.5 10/09/2016   HGB 14.8 10/09/2016   HCT 43.1 10/09/2016   MCV 96.2 10/09/2016   PLT 193 10/09/2016     Chemistry      Component Value Date/Time   NA 138 10/09/2016 1330   K 4.4 10/09/2016 1330   CL 103 10/09/2016 1330   CO2 27 10/09/2016 1330   BUN 16 10/09/2016 1330   CREATININE 1.00 10/09/2016 1330      Component Value Date/Time   CALCIUM 9.5 10/09/2016 1330     EKG: normal EKG, normal sinus rhythm.  Anesthesia Physical Anesthesia Plan  ASA: II  Anesthesia Plan: General   Post-op Pain Management:    Induction: Intravenous  Airway Management Planned: Oral ETT  Additional Equipment:   Intra-op Plan:   Post-operative Plan: Extubation in OR  Informed Consent: I have reviewed the patients History and Physical, chart, labs and discussed the procedure including the risks, benefits and alternatives for the proposed anesthesia with the patient or authorized  representative who has indicated his/her understanding and acceptance.     Plan Discussed with: CRNA, Anesthesiologist and Surgeon  Anesthesia Plan Comments:         Anesthesia Quick Evaluation

## 2016-10-17 NOTE — Anesthesia Procedure Notes (Signed)
Procedure Name: Intubation Date/Time: 10/17/2016 7:20 AM Performed by: Shem Plemmons, Virgel Gess Pre-anesthesia Checklist: Patient identified, Emergency Drugs available, Suction available, Patient being monitored and Timeout performed Patient Re-evaluated:Patient Re-evaluated prior to inductionOxygen Delivery Method: Circle system utilized Preoxygenation: Pre-oxygenation with 100% oxygen Intubation Type: IV induction Ventilation: Mask ventilation without difficulty Laryngoscope Size: Mac and 4 Grade View: Grade II Tube type: Oral Tube size: 7.5 mm Number of attempts: 1 Airway Equipment and Method: Stylet Placement Confirmation: ETT inserted through vocal cords under direct vision,  positive ETCO2,  CO2 detector and breath sounds checked- equal and bilateral Secured at: 22 cm Tube secured with: Tape Dental Injury: Teeth and Oropharynx as per pre-operative assessment

## 2016-10-17 NOTE — Interval H&P Note (Signed)
History and Physical Interval Note:  10/17/2016 6:39 AM  Joshua Lloyd  has presented today for surgery, with the diagnosis of PROSTATE CANCER  The various methods of treatment have been discussed with the patient and family. After consideration of risks, benefits and other options for treatment, the patient has consented to  Procedure(s): ROBOTIC ASSISTED LAPAROSCOPIC RADICAL PROSTATECTOMY LEVEL 2/ BILATERAL PELVIC LYMPHADENECTOMY (N/A) as a surgical intervention .  The patient's history has been reviewed, patient examined, no change in status, stable for surgery.  I have reviewed the patient's chart and labs.  Questions were answered to the patient's satisfaction.     Vesta Wheeland,LES

## 2016-10-17 NOTE — Progress Notes (Signed)
Patient ID: Joshua Lloyd, male   DOB: 03/06/1961, 56 y.o.   MRN: [id number]  Post-op note  Subjective: The patient is doing well.  He complains of right lower back pain.  He states that he did pull a back muscle recently and this feels like that same pain.  He has not yet ambulated.  His pain is improving after IV pain medication.  Objective: Vital signs in last 24 hours: Temp:  [97.4 F (36.3 C)-98.1 F (36.7 C)] 97.6 F (36.4 C) (04/26 1500) Pulse Rate:  [70-98] 70 (04/26 1500) Resp:  [10-18] 14 (04/26 1500) BP: (110-162)/(63-92) 114/63 (04/26 1500) SpO2:  [99 %-100 %] 100 % (04/26 1500) Weight:  [83.9 kg (185 lb)] 83.9 kg (185 lb) (04/26 0541)  Intake/Output from previous day: No intake/output data recorded. Intake/Output this shift: Total I/O In: 1857.5 [P.O.:300; I.V.:1500; IV Piggyback:57.5] Out: 735 [Urine:385; Drains:50; Blood:300]  Physical Exam:  General: Alert and oriented. Abdomen: Soft, Nondistended. Incisions: Clean and dry. GU: Urine clear.   Lab Results:  Recent Labs  10/17/16 1115  HGB 14.1  HCT 41.9    Assessment/Plan: POD#0   1) Continue to monitor, ambulate, IS 2) IV ketorolac, heat, and ambulation to help with back pain   Pryor Curia. MD   LOS: 0 days   Joshua Lloyd,LES 10/17/2016, 4:51 PM

## 2016-10-17 NOTE — Op Note (Signed)

## 2016-10-18 DIAGNOSIS — C61 Malignant neoplasm of prostate: Secondary | ICD-10-CM | POA: Diagnosis not present

## 2016-10-18 LAB — HEMOGLOBIN AND HEMATOCRIT, BLOOD
HEMATOCRIT: 34.5 % — AB (ref 39.0–52.0)
HEMATOCRIT: 36.5 % — AB (ref 39.0–52.0)
HEMOGLOBIN: 12.4 g/dL — AB (ref 13.0–17.0)
Hemoglobin: 11.9 g/dL — ABNORMAL LOW (ref 13.0–17.0)

## 2016-10-18 MED ORDER — HYDROCODONE-ACETAMINOPHEN 5-325 MG PO TABS
1.0000 | ORAL_TABLET | Freq: Four times a day (QID) | ORAL | Status: DC | PRN
  Administered 2016-10-18 (×2): 1 via ORAL
  Filled 2016-10-18 (×2): qty 1

## 2016-10-18 MED ORDER — SULFAMETHOXAZOLE-TRIMETHOPRIM 800-160 MG PO TABS: 1.0000 | ORAL_TABLET | Freq: Two times a day (BID) | ORAL | 0 refills | Status: DC

## 2016-10-18 MED ORDER — BISACODYL 10 MG RE SUPP
10.0000 mg | Freq: Once | RECTAL | Status: AC
  Administered 2016-10-18: 10 mg via RECTAL
  Filled 2016-10-18: qty 1

## 2016-10-18 MED ORDER — DOCUSATE SODIUM 100 MG PO CAPS: 100.0000 mg | ORAL_CAPSULE | Freq: Two times a day (BID) | ORAL | 0 refills | Status: DC

## 2016-10-18 MED ORDER — HYDROCODONE-ACETAMINOPHEN 5-325 MG PO TABS: 1.0000 | ORAL_TABLET | Freq: Four times a day (QID) | ORAL | 0 refills | Status: DC | PRN

## 2016-10-18 NOTE — Progress Notes (Signed)
Patient ID: Joshua Lloyd, male   DOB: May 31, 1961, 56 y.o.   MRN: [id number]  1 Day Post-Op Subjective: The patient is doing well.  His back pain is improved but still present.  Objective: Vital signs in last 24 hours: Temp:  [97.4 F (36.3 C)-98.7 F (37.1 C)] 98.1 F (36.7 C) (04/27 0532) Pulse Rate:  [70-98] 71 (04/27 0532) Resp:  [10-16] 16 (04/27 0532) BP: (99-162)/(52-92) 104/59 (04/27 0532) SpO2:  [98 %-100 %] 100 % (04/27 0532)  Intake/Output from previous day: 04/26 0701 - 04/27 0700 In: 3892.5 [P.O.:300; I.V.:3535; IV Piggyback:57.5] Out: 3415 [Urine:2985; Drains:130; Blood:300] Intake/Output this shift: No intake/output data recorded.  Physical Exam:  General: Alert and oriented. GI: Soft, Nondistended. Incisions: Clean, dry, and intact Urine: Clear Extremities: Nontender, no erythema, no edema.  Lab Results:  Recent Labs  10/17/16 1115 10/18/16 0547  HGB 14.1 11.9*  HCT 41.9 34.5*      Assessment/Plan: POD# 1 s/p robotic prostatectomy.  1) SL IVF 2) Ambulate, Incentive spirometry 3) Transition to oral pain medication 4) Dulcolax suppository 5) D/C pelvic drain 6) Recheck H and H this morning to ensure it is dilutional 7) Plan for likely discharge later today   Pryor Curia. MD   LOS: 0 days   Joshua Lloyd,LES 10/18/2016, 7:47 AM

## 2016-10-18 NOTE — Discharge Instructions (Signed)

## 2016-10-18 NOTE — Discharge Summary (Signed)
  Date of admission: 10/17/2016  Date of discharge: 10/18/2016  Admission diagnosis: Prostate Cancer  Discharge diagnosis: Prostate Cancer  History and Physical: For full details, please see admission history and physical. Briefly, Joshua Lloyd is a 56 y.o. gentleman with localized prostate cancer.  After discussing management/treatment options, he elected to proceed with surgical treatment.  Hospital Course: SPERO GUNNELS was taken to the operating room on 10/17/2016 and underwent a robotic assisted laparoscopic radical prostatectomy. He tolerated this procedure well and without complications. Postoperatively, he was able to be transferred to a regular hospital room following recovery from anesthesia.  He was able to begin ambulating the night of surgery. He remained hemodynamically stable overnight.  He had excellent urine output with appropriately minimal output from his pelvic drain and his pelvic drain was removed on POD #1.  He was transitioned to oral pain medication, tolerated a clear liquid diet, and had met all discharge criteria and was able to be discharged home later on POD#1.  Laboratory values:  Recent Labs  10/17/16 1115 10/18/16 0547 10/18/16 0921  HGB 14.1 11.9* 12.4*  HCT 41.9 34.5* 36.5*    Disposition: Home  Discharge instruction: He was instructed to be ambulatory but to refrain from heavy lifting, strenuous activity, or driving. He was instructed on urethral catheter care.    Followup: He will followup in 1 week for catheter removal and to discuss his surgical pathology results.

## 2016-10-23 ENCOUNTER — Ambulatory Visit (HOSPITAL_COMMUNITY)
Admission: RE | Admit: 2016-10-23 | Discharge: 2016-10-23 | Disposition: A | Discharge status: Home / Self Care | Payer: 59 | Source: Ambulatory Visit | Attending: Internal Medicine | Admitting: Internal Medicine

## 2016-10-23 ENCOUNTER — Other Ambulatory Visit (HOSPITAL_COMMUNITY): Payer: Self-pay | Admitting: Urology

## 2016-10-23 DIAGNOSIS — R609 Edema, unspecified: Secondary | ICD-10-CM

## 2016-10-23 DIAGNOSIS — Z9889 Other specified postprocedural states: Secondary | ICD-10-CM | POA: Insufficient documentation

## 2016-10-23 DIAGNOSIS — M7989 Other specified soft tissue disorders: Secondary | ICD-10-CM | POA: Diagnosis not present

## 2016-10-23 NOTE — Progress Notes (Signed)
*  Preliminary Results* Right lower extremity venous duplex completed. Right lower extremity is negative for deep vein thrombosis. There is no evidence of right Baker's cyst.  10/23/2016 4:53 PM  Maudry Mayhew, BS, RVT, RDCS, RDMS

## 2016-11-14 DIAGNOSIS — M6281 Muscle weakness (generalized): Secondary | ICD-10-CM | POA: Diagnosis not present

## 2016-11-14 DIAGNOSIS — M62838 Other muscle spasm: Secondary | ICD-10-CM | POA: Diagnosis not present

## 2016-12-05 DIAGNOSIS — C44112 Basal cell carcinoma of skin of right eyelid, including canthus: Secondary | ICD-10-CM | POA: Diagnosis not present

## 2016-12-12 DIAGNOSIS — L570 Actinic keratosis: Secondary | ICD-10-CM | POA: Diagnosis not present

## 2017-03-12 DIAGNOSIS — C61 Malignant neoplasm of prostate: Secondary | ICD-10-CM | POA: Diagnosis not present

## 2017-03-14 DIAGNOSIS — C61 Malignant neoplasm of prostate: Secondary | ICD-10-CM | POA: Diagnosis not present

## 2017-04-01 DIAGNOSIS — H10413 Chronic giant papillary conjunctivitis, bilateral: Secondary | ICD-10-CM | POA: Diagnosis not present

## 2017-07-15 DIAGNOSIS — H7411 Adhesive right middle ear disease: Secondary | ICD-10-CM | POA: Diagnosis not present

## 2017-07-15 DIAGNOSIS — H903 Sensorineural hearing loss, bilateral: Secondary | ICD-10-CM | POA: Diagnosis not present

## 2017-07-15 DIAGNOSIS — H6981 Other specified disorders of Eustachian tube, right ear: Secondary | ICD-10-CM | POA: Diagnosis not present

## 2017-07-15 DIAGNOSIS — Z23 Encounter for immunization: Secondary | ICD-10-CM | POA: Diagnosis not present

## 2017-09-02 DIAGNOSIS — Z0000 Encounter for general adult medical examination without abnormal findings: Secondary | ICD-10-CM | POA: Diagnosis not present

## 2017-09-05 DIAGNOSIS — C61 Malignant neoplasm of prostate: Secondary | ICD-10-CM | POA: Diagnosis not present

## 2017-09-12 DIAGNOSIS — C61 Malignant neoplasm of prostate: Secondary | ICD-10-CM | POA: Diagnosis not present

## 2017-09-18 DIAGNOSIS — Z85828 Personal history of other malignant neoplasm of skin: Secondary | ICD-10-CM | POA: Diagnosis not present

## 2017-09-18 DIAGNOSIS — L570 Actinic keratosis: Secondary | ICD-10-CM | POA: Diagnosis not present

## 2017-09-18 DIAGNOSIS — L821 Other seborrheic keratosis: Secondary | ICD-10-CM | POA: Diagnosis not present

## 2017-12-04 DIAGNOSIS — Z23 Encounter for immunization: Secondary | ICD-10-CM | POA: Diagnosis not present

## 2017-12-12 DIAGNOSIS — L821 Other seborrheic keratosis: Secondary | ICD-10-CM | POA: Diagnosis not present

## 2017-12-12 DIAGNOSIS — Z85828 Personal history of other malignant neoplasm of skin: Secondary | ICD-10-CM | POA: Diagnosis not present

## 2017-12-12 DIAGNOSIS — L570 Actinic keratosis: Secondary | ICD-10-CM | POA: Diagnosis not present

## 2018-02-27 DIAGNOSIS — Z85828 Personal history of other malignant neoplasm of skin: Secondary | ICD-10-CM | POA: Diagnosis not present

## 2018-02-27 DIAGNOSIS — L821 Other seborrheic keratosis: Secondary | ICD-10-CM | POA: Diagnosis not present

## 2018-02-27 DIAGNOSIS — L570 Actinic keratosis: Secondary | ICD-10-CM | POA: Diagnosis not present

## 2018-02-27 DIAGNOSIS — L820 Inflamed seborrheic keratosis: Secondary | ICD-10-CM | POA: Diagnosis not present

## 2018-03-16 DIAGNOSIS — C61 Malignant neoplasm of prostate: Secondary | ICD-10-CM | POA: Diagnosis not present

## 2018-03-20 DIAGNOSIS — C61 Malignant neoplasm of prostate: Secondary | ICD-10-CM | POA: Diagnosis not present

## 2018-04-23 DIAGNOSIS — Z23 Encounter for immunization: Secondary | ICD-10-CM | POA: Diagnosis not present

## 2018-05-15 DIAGNOSIS — H11041 Peripheral pterygium, stationary, right eye: Secondary | ICD-10-CM | POA: Diagnosis not present

## 2018-08-17 DIAGNOSIS — L237 Allergic contact dermatitis due to plants, except food: Secondary | ICD-10-CM | POA: Diagnosis not present

## 2018-08-17 DIAGNOSIS — R21 Rash and other nonspecific skin eruption: Secondary | ICD-10-CM | POA: Diagnosis not present

## 2018-08-24 DIAGNOSIS — C61 Malignant neoplasm of prostate: Secondary | ICD-10-CM | POA: Diagnosis not present

## 2018-11-02 DIAGNOSIS — K645 Perianal venous thrombosis: Secondary | ICD-10-CM | POA: Diagnosis not present

## 2019-07-26 ENCOUNTER — Ambulatory Visit: Payer: BC Managed Care – PPO | Attending: Internal Medicine

## 2019-07-26 DIAGNOSIS — Z20828 Contact with and (suspected) exposure to other viral communicable diseases: Secondary | ICD-10-CM | POA: Diagnosis not present

## 2019-07-26 DIAGNOSIS — Z03818 Encounter for observation for suspected exposure to other biological agents ruled out: Secondary | ICD-10-CM | POA: Diagnosis not present

## 2019-07-26 DIAGNOSIS — Z20822 Contact with and (suspected) exposure to covid-19: Secondary | ICD-10-CM

## 2019-07-26 DIAGNOSIS — U071 COVID-19: Secondary | ICD-10-CM | POA: Diagnosis not present

## 2019-07-27 LAB — NOVEL CORONAVIRUS, NAA: SARS-CoV-2, NAA: DETECTED — AB

## 2019-07-28 ENCOUNTER — Other Ambulatory Visit: Payer: Self-pay | Admitting: Nurse Practitioner

## 2019-07-28 DIAGNOSIS — U071 COVID-19: Secondary | ICD-10-CM

## 2019-07-28 DIAGNOSIS — C61 Malignant neoplasm of prostate: Secondary | ICD-10-CM

## 2019-07-28 NOTE — Progress Notes (Signed)
  I connected by phone with Joshua Lloyd on 07/28/2019 at 1:23 PM to discuss the potential use of an new treatment for mild to moderate COVID-19 viral infection in non-hospitalized patients.  This patient is a 59 y.o. male that meets the FDA criteria for Emergency Use Authorization of bamlanivimab or casirivimab\imdevimab.  Has a (+) direct SARS-CoV-2 viral test result  Has mild or moderate COVID-19   Is ? 60 years of age and weighs ? 40 kg  Is NOT hospitalized due to COVID-19  Is NOT requiring oxygen therapy or requiring an increase in baseline oxygen flow rate due to COVID-19  Is within 10 days of symptom onset  Has at least one of the high risk factor(s) for progression to severe COVID-19 and/or hospitalization as defined in EUA.  Specific high risk criteria : Immunosuppressive Disease   I have spoken and communicated the following to the patient or parent/caregiver:  1. FDA has authorized the emergency use of bamlanivimab and casirivimab\imdevimab for the treatment of mild to moderate COVID-19 in adults and pediatric patients with positive results of direct SARS-CoV-2 viral testing who are 51 years of age and older weighing at least 40 kg, and who are at high risk for progressing to severe COVID-19 and/or hospitalization.  2. The significant known and potential risks and benefits of bamlanivimab and casirivimab\imdevimab, and the extent to which such potential risks and benefits are unknown.  3. Information on available alternative treatments and the risks and benefits of those alternatives, including clinical trials.  4. Patients treated with bamlanivimab and casirivimab\imdevimab should continue to self-isolate and use infection control measures (e.g., wear mask, isolate, social distance, avoid sharing personal items, clean and disinfect "high touch" surfaces, and frequent handwashing) according to CDC guidelines.   5. The patient or parent/caregiver has the option to accept  or refuse bamlanivimab or casirivimab\imdevimab .  After reviewing this information with the patient, The patient agreed to proceed with receiving the bamlanimivab infusion and will be provided a copy of the Fact sheet prior to receiving the infusion.[name] 07/28/2019 1:23 PM

## 2019-07-29 ENCOUNTER — Ambulatory Visit (HOSPITAL_COMMUNITY)
Admission: RE | Admit: 2019-07-29 | Discharge: 2019-07-29 | Disposition: A | Discharge status: Home / Self Care | Payer: BC Managed Care – PPO | Source: Ambulatory Visit | Attending: Pulmonary Disease | Admitting: Pulmonary Disease

## 2019-07-29 ENCOUNTER — Encounter (HOSPITAL_COMMUNITY): Payer: Self-pay

## 2019-07-29 DIAGNOSIS — U071 COVID-19: Secondary | ICD-10-CM | POA: Diagnosis not present

## 2019-07-29 DIAGNOSIS — C61 Malignant neoplasm of prostate: Secondary | ICD-10-CM | POA: Diagnosis not present

## 2019-07-29 DIAGNOSIS — Z23 Encounter for immunization: Secondary | ICD-10-CM | POA: Diagnosis not present

## 2019-07-29 MED ORDER — DIPHENHYDRAMINE HCL 50 MG/ML IJ SOLN: 50.0000 mg | Freq: Once | INTRAMUSCULAR | Status: DC | PRN

## 2019-07-29 MED ORDER — ALBUTEROL SULFATE HFA 108 (90 BASE) MCG/ACT IN AERS: 2.0000 | INHALATION_SPRAY | Freq: Once | RESPIRATORY_TRACT | Status: DC | PRN

## 2019-07-29 MED ORDER — FAMOTIDINE IN NACL 20-0.9 MG/50ML-% IV SOLN: 20.0000 mg | Freq: Once | INTRAVENOUS | Status: DC | PRN

## 2019-07-29 MED ORDER — EPINEPHRINE 0.3 MG/0.3ML IJ SOAJ: 0.3000 mg | Freq: Once | INTRAMUSCULAR | Status: DC | PRN

## 2019-07-29 MED ORDER — SODIUM CHLORIDE 0.9 % IV SOLN
700.0000 mg | Freq: Once | INTRAVENOUS | Status: AC
  Administered 2019-07-29: 700 mg via INTRAVENOUS
  Filled 2019-07-29: qty 20

## 2019-07-29 MED ORDER — METHYLPREDNISOLONE SODIUM SUCC 125 MG IJ SOLR: 125.0000 mg | Freq: Once | INTRAMUSCULAR | Status: DC | PRN

## 2019-07-29 MED ORDER — SODIUM CHLORIDE 0.9 % IV SOLN
INTRAVENOUS | Status: DC | PRN
  Administered 2019-07-29: 250 mL via INTRAVENOUS

## 2019-07-29 NOTE — Discharge Instructions (Signed)

## 2019-07-29 NOTE — Progress Notes (Signed)
  Diagnosis: COVID-19  Physician: Dr. Patrick Wright  Procedure: Covid Infusion Clinic Med: bamlanivimab infusion - Provided patient with bamlanimivab fact sheet for patients, parents and caregivers prior to infusion.  Complications: No immediate complications noted.  Discharge: Discharged home   Ally Yow 07/29/2019   

## 2019-08-02 ENCOUNTER — Other Ambulatory Visit: Payer: BC Managed Care – PPO

## 2019-08-19 DIAGNOSIS — C61 Malignant neoplasm of prostate: Secondary | ICD-10-CM | POA: Diagnosis not present

## 2019-08-20 DIAGNOSIS — N5231 Erectile dysfunction following radical prostatectomy: Secondary | ICD-10-CM | POA: Diagnosis not present

## 2019-08-20 DIAGNOSIS — C61 Malignant neoplasm of prostate: Secondary | ICD-10-CM | POA: Diagnosis not present

## 2019-12-23 ENCOUNTER — Ambulatory Visit: Payer: BC Managed Care – PPO | Attending: Internal Medicine

## 2019-12-23 DIAGNOSIS — Z23 Encounter for immunization: Secondary | ICD-10-CM

## 2019-12-23 NOTE — Progress Notes (Signed)
   Covid-19 Vaccination Clinic  Name:  Joshua Lloyd    MRN: [id number] DOB: 1960/12/17  12/23/2019  Joshua Lloyd was observed post Covid-19 immunization for 15 minutes without incident. He was provided with Vaccine Information Sheet and instruction to access the V-Safe system.   Joshua Lloyd was instructed to call 911 with any severe reactions post vaccine: Marland Kitchen Difficulty breathing  . Swelling of face and throat  . A fast heartbeat  . A bad rash all over body  . Dizziness and weakness   Immunizations Administered    Name Date Dose VIS Date Route   Pfizer COVID-19 Vaccine 12/23/2019  8:53 AM 0.3 mL 08/18/2018 Intramuscular   Manufacturer: [place]   Lot: [id number]   [place]: [address]-3

## 2020-01-17 ENCOUNTER — Ambulatory Visit: Payer: BC Managed Care – PPO | Attending: Internal Medicine

## 2020-01-17 DIAGNOSIS — Z23 Encounter for immunization: Secondary | ICD-10-CM

## 2020-01-17 NOTE — Progress Notes (Signed)
   Covid-19 Vaccination Clinic  Name:  Joshua Lloyd    MRN: [id number] DOB: 1960/09/15  01/17/2020  Mr. Joshua Lloyd was observed post Covid-19 immunization for 15 minutes without incident. He was provided with Vaccine Information Sheet and instruction to access the V-Safe system.   Mr. Joshua Lloyd was instructed to call 911 with any severe reactions post vaccine: Marland Kitchen Difficulty breathing  . Swelling of face and throat  . A fast heartbeat  . A bad rash all over body  . Dizziness and weakness   Immunizations Administered    Name Date Dose VIS Date Route   Pfizer COVID-19 Vaccine 01/17/2020  8:58 AM 0.3 mL 08/18/2018 Intramuscular   Manufacturer: [place]   Lot: [id number]   [place]: [id number]

## 2020-03-27 DIAGNOSIS — Z85828 Personal history of other malignant neoplasm of skin: Secondary | ICD-10-CM | POA: Diagnosis not present

## 2020-03-27 DIAGNOSIS — L570 Actinic keratosis: Secondary | ICD-10-CM | POA: Diagnosis not present

## 2020-03-27 DIAGNOSIS — C44219 Basal cell carcinoma of skin of left ear and external auricular canal: Secondary | ICD-10-CM | POA: Diagnosis not present

## 2020-03-27 DIAGNOSIS — L812 Freckles: Secondary | ICD-10-CM | POA: Diagnosis not present

## 2020-03-27 DIAGNOSIS — L821 Other seborrheic keratosis: Secondary | ICD-10-CM | POA: Diagnosis not present

## 2020-03-29 DIAGNOSIS — M79645 Pain in left finger(s): Secondary | ICD-10-CM | POA: Diagnosis not present

## 2020-03-29 DIAGNOSIS — S63633A Sprain of interphalangeal joint of left middle finger, initial encounter: Secondary | ICD-10-CM | POA: Diagnosis not present

## 2020-04-20 DIAGNOSIS — C44219 Basal cell carcinoma of skin of left ear and external auricular canal: Secondary | ICD-10-CM | POA: Diagnosis not present

## 2020-08-18 DIAGNOSIS — B0089 Other herpesviral infection: Secondary | ICD-10-CM | POA: Diagnosis not present

## 2021-01-04 DIAGNOSIS — Z833 Family history of diabetes mellitus: Secondary | ICD-10-CM | POA: Diagnosis not present

## 2021-01-04 DIAGNOSIS — R682 Dry mouth, unspecified: Secondary | ICD-10-CM | POA: Diagnosis not present

## 2021-01-04 DIAGNOSIS — R631 Polydipsia: Secondary | ICD-10-CM | POA: Diagnosis not present

## 2021-01-04 DIAGNOSIS — R634 Abnormal weight loss: Secondary | ICD-10-CM | POA: Diagnosis not present

## 2021-02-06 DIAGNOSIS — F419 Anxiety disorder, unspecified: Secondary | ICD-10-CM | POA: Diagnosis not present

## 2021-02-08 DIAGNOSIS — F4323 Adjustment disorder with mixed anxiety and depressed mood: Secondary | ICD-10-CM | POA: Diagnosis not present

## 2021-02-21 DIAGNOSIS — Z8546 Personal history of malignant neoplasm of prostate: Secondary | ICD-10-CM | POA: Diagnosis not present

## 2021-02-21 DIAGNOSIS — Z0000 Encounter for general adult medical examination without abnormal findings: Secondary | ICD-10-CM | POA: Diagnosis not present

## 2021-02-21 DIAGNOSIS — F321 Major depressive disorder, single episode, moderate: Secondary | ICD-10-CM | POA: Diagnosis not present

## 2021-02-21 DIAGNOSIS — Z13220 Encounter for screening for lipoid disorders: Secondary | ICD-10-CM | POA: Diagnosis not present

## 2021-02-21 DIAGNOSIS — R519 Headache, unspecified: Secondary | ICD-10-CM | POA: Diagnosis not present

## 2021-02-23 ENCOUNTER — Other Ambulatory Visit: Payer: Self-pay | Admitting: Internal Medicine

## 2021-02-23 DIAGNOSIS — R519 Headache, unspecified: Secondary | ICD-10-CM

## 2021-03-01 DIAGNOSIS — F4323 Adjustment disorder with mixed anxiety and depressed mood: Secondary | ICD-10-CM | POA: Diagnosis not present

## 2021-03-02 ENCOUNTER — Other Ambulatory Visit: Payer: Self-pay

## 2021-03-02 ENCOUNTER — Ambulatory Visit
Admission: RE | Admit: 2021-03-02 | Discharge: 2021-03-02 | Disposition: A | Discharge status: Home / Self Care | Payer: BC Managed Care – PPO | Source: Ambulatory Visit | Attending: Internal Medicine | Admitting: Internal Medicine

## 2021-03-02 DIAGNOSIS — R519 Headache, unspecified: Secondary | ICD-10-CM

## 2021-03-02 DIAGNOSIS — J341 Cyst and mucocele of nose and nasal sinus: Secondary | ICD-10-CM | POA: Diagnosis not present

## 2021-03-02 MED ORDER — GADOBENATE DIMEGLUMINE 529 MG/ML IV SOLN
16.0000 mL | Freq: Once | INTRAVENOUS | Status: AC | PRN
  Administered 2021-03-02: 16 mL via INTRAVENOUS

## 2021-03-12 ENCOUNTER — Ambulatory Visit: Payer: BC Managed Care – PPO | Admitting: Behavioral Health

## 2021-03-21 DIAGNOSIS — H11001 Unspecified pterygium of right eye: Secondary | ICD-10-CM | POA: Diagnosis not present

## 2021-03-21 DIAGNOSIS — F4323 Adjustment disorder with mixed anxiety and depressed mood: Secondary | ICD-10-CM | POA: Diagnosis not present

## 2021-03-21 DIAGNOSIS — H52203 Unspecified astigmatism, bilateral: Secondary | ICD-10-CM | POA: Diagnosis not present

## 2021-03-21 DIAGNOSIS — H5213 Myopia, bilateral: Secondary | ICD-10-CM | POA: Diagnosis not present

## 2021-03-21 DIAGNOSIS — H524 Presbyopia: Secondary | ICD-10-CM | POA: Diagnosis not present

## 2021-04-04 DIAGNOSIS — F251 Schizoaffective disorder, depressive type: Secondary | ICD-10-CM | POA: Diagnosis not present

## 2021-04-05 ENCOUNTER — Ambulatory Visit (HOSPITAL_COMMUNITY)
Admission: EM | Admit: 2021-04-05 | Discharge: 2021-04-05 | Disposition: A | Discharge status: Other Acute Inpatient Hospital | Payer: BC Managed Care – PPO | Attending: Family | Admitting: Family

## 2021-04-05 ENCOUNTER — Emergency Department (HOSPITAL_COMMUNITY): Payer: BC Managed Care – PPO

## 2021-04-05 ENCOUNTER — Other Ambulatory Visit: Payer: Self-pay

## 2021-04-05 ENCOUNTER — Encounter (HOSPITAL_COMMUNITY): Payer: Self-pay

## 2021-04-05 ENCOUNTER — Emergency Department (HOSPITAL_COMMUNITY)
Admission: EM | Admit: 2021-04-05 | Discharge: 2021-04-05 | Disposition: A | Discharge status: Other Acute Inpatient Hospital | Payer: BC Managed Care – PPO | Source: Home / Self Care | Attending: Emergency Medicine | Admitting: Emergency Medicine

## 2021-04-05 DIAGNOSIS — Z8546 Personal history of malignant neoplasm of prostate: Secondary | ICD-10-CM | POA: Insufficient documentation

## 2021-04-05 DIAGNOSIS — F419 Anxiety disorder, unspecified: Secondary | ICD-10-CM

## 2021-04-05 DIAGNOSIS — F22 Delusional disorders: Secondary | ICD-10-CM

## 2021-04-05 DIAGNOSIS — Z046 Encounter for general psychiatric examination, requested by authority: Secondary | ICD-10-CM | POA: Insufficient documentation

## 2021-04-05 DIAGNOSIS — R4182 Altered mental status, unspecified: Secondary | ICD-10-CM | POA: Insufficient documentation

## 2021-04-05 DIAGNOSIS — F29 Unspecified psychosis not due to a substance or known physiological condition: Secondary | ICD-10-CM | POA: Insufficient documentation

## 2021-04-05 DIAGNOSIS — Z20822 Contact with and (suspected) exposure to covid-19: Secondary | ICD-10-CM | POA: Insufficient documentation

## 2021-04-05 DIAGNOSIS — F323 Major depressive disorder, single episode, severe with psychotic features: Secondary | ICD-10-CM | POA: Diagnosis not present

## 2021-04-05 DIAGNOSIS — Z820 Family history of epilepsy and other diseases of the nervous system: Secondary | ICD-10-CM | POA: Diagnosis not present

## 2021-04-05 DIAGNOSIS — G4700 Insomnia, unspecified: Secondary | ICD-10-CM | POA: Diagnosis not present

## 2021-04-05 DIAGNOSIS — Z23 Encounter for immunization: Secondary | ICD-10-CM | POA: Diagnosis not present

## 2021-04-05 LAB — POCT URINE DRUG SCREEN - MANUAL ENTRY (I-SCREEN)
POC Amphetamine UR: NOT DETECTED
POC Buprenorphine (BUP): NOT DETECTED
POC Cocaine UR: NOT DETECTED
POC Marijuana UR: NOT DETECTED
POC Methadone UR: NOT DETECTED
POC Methamphetamine UR: NOT DETECTED
POC Morphine: NOT DETECTED
POC Oxazepam (BZO): NOT DETECTED
POC Oxycodone UR: NOT DETECTED
POC Secobarbital (BAR): NOT DETECTED

## 2021-04-05 LAB — COMPREHENSIVE METABOLIC PANEL
ALT: 16 U/L (ref 0–44)
AST: 30 U/L (ref 15–41)
Albumin: 4.3 g/dL (ref 3.5–5.0)
Alkaline Phosphatase: 82 U/L (ref 38–126)
Anion gap: 8 (ref 5–15)
BUN: 16 mg/dL (ref 8–23)
CO2: 28 mmol/L (ref 22–32)
Calcium: 9.7 mg/dL (ref 8.9–10.3)
Chloride: 102 mmol/L (ref 98–111)
Creatinine, Ser: 1.1 mg/dL (ref 0.61–1.24)
GFR, Estimated: >60 mL/min (ref >60)
Glucose, Bld: 92 mg/dL (ref 70–99)
Potassium: 4.7 mmol/L (ref 3.5–5.1)
Sodium: 138 mmol/L (ref 135–145)
Total Bilirubin: 0.6 mg/dL (ref 0.3–1.2)
Total Protein: 7 g/dL (ref 6.5–8.1)

## 2021-04-05 LAB — CBC WITH DIFFERENTIAL/PLATELET
Abs Immature Granulocytes: 0.03 10*3/uL (ref 0.00–0.07)
Basophils Absolute: 0.1 10*3/uL (ref 0.0–0.1)
Basophils Relative: 1 %
Eosinophils Absolute: 0.1 10*3/uL (ref 0.0–0.5)
Eosinophils Relative: 1 %
HCT: 48.4 % (ref 39.0–52.0)
Hemoglobin: 16.5 g/dL (ref 13.0–17.0)
Immature Granulocytes: 0 %
Lymphocytes Relative: 20 %
Lymphs Abs: 1.7 10*3/uL (ref 0.7–4.0)
MCH: 33.3 pg (ref 26.0–34.0)
MCHC: 34.1 g/dL (ref 30.0–36.0)
MCV: 97.6 fL (ref 80.0–100.0)
Monocytes Absolute: 0.8 10*3/uL (ref 0.1–1.0)
Monocytes Relative: 10 %
Neutro Abs: 5.5 10*3/uL (ref 1.7–7.7)
Neutrophils Relative %: 68 %
Platelets: 226 10*3/uL (ref 150–400)
RBC: 4.96 MIL/uL (ref 4.22–5.81)
RDW: 12.1 % (ref 11.5–15.5)
WBC: 8.1 10*3/uL (ref 4.0–10.5)
nRBC: 0 % (ref 0.0–0.2)

## 2021-04-05 LAB — RESP PANEL BY RT-PCR (FLU A&B, COVID) ARPGX2
Influenza A by PCR: NEGATIVE
Influenza B by PCR: NEGATIVE
SARS Coronavirus 2 by RT PCR: NEGATIVE

## 2021-04-05 LAB — LIPID PANEL
Cholesterol: 180 mg/dL (ref 0–200)
HDL: 59 mg/dL (ref >40)
LDL Cholesterol: 97 mg/dL (ref 0–99)
Total CHOL/HDL Ratio: 3.1 RATIO
Triglycerides: 119 mg/dL (ref <150)
VLDL: 24 mg/dL (ref 0–40)

## 2021-04-05 LAB — ETHANOL: Alcohol, Ethyl (B): <10 mg/dL (ref <10)

## 2021-04-05 LAB — HEMOGLOBIN A1C
Hgb A1c MFr Bld: 5.7 % — ABNORMAL HIGH (ref 4.8–5.6)
Mean Plasma Glucose: 116.89 mg/dL

## 2021-04-05 LAB — MAGNESIUM: Magnesium: 2.1 mg/dL (ref 1.7–2.4)

## 2021-04-05 LAB — POC SARS CORONAVIRUS 2 AG -  ED: SARS Coronavirus 2 Ag: NEGATIVE

## 2021-04-05 LAB — TSH: TSH: 1.374 u[IU]/mL (ref 0.350–4.500)

## 2021-04-05 MED ORDER — ALUM & MAG HYDROXIDE-SIMETH 200-200-20 MG/5ML PO SUSP: 30.0000 mL | ORAL | Status: DC | PRN

## 2021-04-05 MED ORDER — ARIPIPRAZOLE 5 MG PO TABS
5.0000 mg | ORAL_TABLET | Freq: Every day | ORAL | Status: DC
  Administered 2021-04-05: 5 mg via ORAL
  Filled 2021-04-05: qty 1

## 2021-04-05 MED ORDER — HYDROXYZINE HCL 25 MG PO TABS: 25.0000 mg | ORAL_TABLET | Freq: Three times a day (TID) | ORAL | Status: DC | PRN

## 2021-04-05 MED ORDER — ACETAMINOPHEN 325 MG PO TABS: 650.0000 mg | ORAL_TABLET | Freq: Four times a day (QID) | ORAL | Status: DC | PRN

## 2021-04-05 MED ORDER — ARIPIPRAZOLE 5 MG PO TABS: 5.0000 mg | ORAL_TABLET | Freq: Once | ORAL | Status: DC

## 2021-04-05 MED ORDER — MAGNESIUM HYDROXIDE 400 MG/5ML PO SUSP: 30.0000 mL | Freq: Every day | ORAL | Status: DC | PRN

## 2021-04-05 MED ORDER — CLONAZEPAM 0.5 MG PO TABS: 0.5000 mg | ORAL_TABLET | Freq: Every day | ORAL | Status: DC | PRN

## 2021-04-05 MED ORDER — CLONAZEPAM 0.5 MG PO TABS: 0.5000 mg | ORAL_TABLET | Freq: Every day | ORAL | Status: DC

## 2021-04-05 NOTE — Progress Notes (Addendum)
Pt was accepted to [facility] 300-1 Pt meets inpatient criteria per [name], FNP. Dx paranoid. Adult unit: [phone] Phone number for report. Pt can admit today 04/06/21 after 0800am  Pt meets inpatient criteria per [name], FNP.   Attending Physician will be Nelda Marseille  Report can be called to: -Adult unit: [phone]  Pt can arrive 04/06/21 after 0800am  Treatment team notified via secure chat: Paulita Cradle, Vonzella Nipple, RN, Heather Roberts, [facility] Wynonia Hazard, RN, Burnett Corrente, RN, Bobbe Medico, [name], FNP, and Margorie John, PA-C.   Nadara Mode, LCSWA 04/05/2021 @ 11:43 PM

## 2021-04-05 NOTE — BH Assessment (Signed)
Comprehensive Clinical Assessment (CCA) Note  04/05/2021 Joshua Lloyd [id number]   Disposition:  Per [name], NP, patient is recommended for inpatient treatment  The patient demonstrates the following risk factors for suicide: Chronic risk factors for suicide include: psychiatric disorder of depression and demographic factors (male, >61 y/o). Acute risk factors for suicide include: family or marital conflict and social withdrawal/isolation. Protective factors for this patient include: positive social support, positive therapeutic relationship, responsibility to others (children, family), and hope for the future. Considering these factors, the overall suicide risk at this point appears to be moderate. Patient is not appropriate for outpatient follow up due to his current psychosis.   [place] ED from 04/05/2021 in [facility]  PHQ-2 Total Score 5  PHQ-9 Total Score 14      Flowsheet Row ED from 04/05/2021 in [place] No Risk        Chief Complaint: No chief complaint on file.  Visit Diagnosis: F29 Psychosis Unspecified   CCA Screening, Triage and Referral (STR)  Patient Reported Information How did you hear about [place]? -- (Dr. Casimiro Needle)  What Is the Reason for Your Visit/Call Today? Patient was referred to the [facility] by Dr. Casimiro Needle.  He states that has seen [name] 2-4 times in the past. He states that he started seeing Dr. Casimiro Needle because of some issues at work. He states that people at work have lost confidence in him and he states that he has made some work related mistakes. Wife states that he had a big work transition that caused him to be very stressed out to the point that he was not sleeping well, eating and lots of anxiety.  Wife states that patient are out to get him and that there are survalience cameras in his home.  Patient was started on Zoloft and the paranoia went  away.  He recently started remeron and the paranoia returned. No Si/HI and no previous attempts.  Patient was prescribed Abilify, but refused to take it. Patient was given an injection yesterday, but he is not sure what medication he was given.  Patient is very paranoid and suspisious.  Patient denies the use of any drugs or alcohol.  Patient's wife Gerome Kokesh) states that patient wakes her up all during the night thinking that people are coming to get them.  She states that he sleeps in his clothes.  She states that she cannot leave him alone.  She also reports that patient will not interact with any of their friends and he does not want to leave the house.  Patient is alert and oriented, but does not appear to be aware or accepting of his current situation and is in denial that he has mental illness.  Patient is anxious and suspicious/delusional.  His judgment, insight and impulse control are impaired.  He does not appear to be responding to any internal stimuli.  His memory is intact and for the most part, his thoughts are organized.  His speech is normal in rate and tone and his eye contact is good.   How Long Has This Been Causing You Problems? 1-6 months  What Do You Feel Would Help You the Most Today? Treatment for Depression or other mood problem   Have You Recently Had Any Thoughts About Hurting Yourself? No  Are You Planning to Commit Suicide/Harm Yourself At This time? No   Have you Recently Had Thoughts About  Hurting Someone Gu[place] Dawn? No  Are You Planning to Harm Someone at This Time? No  Explanation: No data recorded  Have You Used Any Alcohol or Drugs in the Past 24 Hours? No  How Long Ago Did You Use Drugs or Alcohol? No data recorded What Did You Use and How Much? No data recorded  Do You Currently Have a Therapist/Psychiatrist? No data recorded Name of Therapist/Psychiatrist: No data recorded  Have You Been Recently Discharged From Any Office Practice or Programs? No  data recorded Explanation of Discharge From Practice/Program: No data recorded    CCA Screening Triage Referral Assessment Type of Contact: No data recorded Telemedicine Service Delivery:   Is this Initial or Reassessment? No data recorded Date Telepsych consult ordered in [facility]:  No data recorded Time Telepsych consult ordered in [facility]:  No data recorded Location of Assessment: No data recorded Provider Location: No data recorded  Collateral Involvement: No data recorded  Does Patient Have a [place]? No data recorded Name and Contact of Legal Guardian: No data recorded If Minor and Not Living with Parent(s), Who has Custody? No data recorded Is CPS involved or ever been involved? No data recorded Is APS involved or ever been involved? No data recorded  Patient Determined To Be At Risk for Harm To Self or Others Based on Review of Patient Reported Information or Presenting Complaint? No data recorded Method: No data recorded Availability of Means: No data recorded Intent: No data recorded Notification Required: No data recorded Additional Information for Danger to Others Potential: No data recorded Additional Comments for Danger to Others Potential: No data recorded Are There Guns or Other Weapons in Your Home? No data recorded Types of Guns/Weapons: No data recorded Are These Weapons Safely Secured?                            No data recorded Who Could Verify You Are Able To Have These Secured: No data recorded Do You Have any Outstanding Charges, Pending Court Dates, Parole/Probation? No data recorded Contacted To Inform of Risk of Harm To Self or Others: No data recorded   Does Patient Present under Involuntary Commitment? 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 [place] Symptoms Depression:   Change in energy/activity; Difficulty Concentrating   Duration of Depressive symptoms:  Duration of Depressive Symptoms: Greater than two weeks   Mania:   None   Anxiety:    Difficulty concentrating; Restlessness; Tension; Worrying   Psychosis:   Delusions (paranoid delusions)   Duration of Psychotic symptoms:  Duration of Psychotic Symptoms: Less than six months   Trauma:   None   Obsessions:   None   Compulsions:   None   Inattention:   None   Hyperactivity/Impulsivity:   None   Oppositional/Defiant Behaviors:   None   Emotional Irregularity:   Unstable self-image   Other Mood/Personality Symptoms:   depressed mood, paranoid delusions and suspiciousness    Mental Status Exam Appearance and self-care  Stature:   Tall   Weight:  Thin   Clothing:   Casual; Neat/clean   Grooming:   Well-groomed   Cosmetic use:   None   Posture/gait:   Normal   Motor activity:   Not Remarkable   Sensorium  Attention:   Normal   Concentration:   Normal   Orientation:   Object; Person; Time; Place   Recall/memory:   Normal   Affect and Mood  Affect:   Depressed; Anxious   Mood:   Depressed; Anxious   Relating  Eye contact:   Normal   Facial expression:   Depressed; Anxious   Attitude toward examiner:   Guarded; Suspicious   Thought and Language  Speech flow:  Clear and Coherent   Thought content:   Appropriate to Mood and Circumstances   Preoccupation:   Other (Comment) (paranoid delusions)   Hallucinations:   None   Organization:  No data recorded  [employer] of Knowledge:   Good    Intelligence:   Above Average   Abstraction:   Normal   Judgement:   Impaired   Reality Testing:   Distorted   Insight:   Lacking   Decision Making:   Impulsive   Social Functioning  Social Maturity:   Isolates   Social Judgement:   Normal   Stress  Stressors:   Transitions; Work   Coping Ability:   Programme researcher, broadcasting/film/video Deficits:   Interpersonal; Activities of daily living   Supports:   Family     Religion: Religion/Spirituality Are You A Religious Person?:  (not assessed) How Might This Affect Treatment?: N/A  Leisure/Recreation: Leisure / Recreation Do You Have Hobbies?: No  Exercise/Diet: Exercise/Diet Do You Exercise?: No Have You Gained or Lost A Significant Amount of Weight in the Past Six Months?: No Do You Follow a Special Diet?: No Do You Have Any Trouble Sleeping?: Yes Explanation of Sleeping Difficulties: broken sleep   CCA Employment/Education Employment/Work Situation: Employment / Work Situation Employment Situation: Employed Work Stressors: new Education administrator has Been Impacted by Current Illness: Yes Describe how Patient's Job has Been Impacted: unable to function to work Has Patient ever Been in Passenger transport manager?: No  Education: Education Is Patient Currently Attending School?: No Last Grade Completed: 12 (not assessed) Did Physicist, medical?:  (not assessed) Did You Have An Individualized Education Program (IIEP): No Did You Have Any Difficulty At School?: No Patient's Education Has Been Impacted by Current Illness: No   CCA Family/Childhood History Family and Relationship History: Family history Marital status: Married Number of Years Married:  (not assessed) What types of issues is patient dealing with in the relationship?: mental illness is troubling to wife and affecting her well being Does patient have children?: Yes How many children?:  (unknown) How is patient's relationship with their children?:  Patient has a good relationship with his children and his grandchildren  Childhood History:  Childhood History By whom was/is the patient raised?: Both parents Did patient suffer any verbal/emotional/physical/sexual abuse as a child?: No Did patient suffer from severe childhood neglect?: No Has patient ever been sexually abused/assaulted/raped as an adolescent or adult?: No Was the patient ever a victim of a crime or a disaster?: No Witnessed domestic violence?: No Has patient been affected by domestic violence as an adult?: No  Child/Adolescent Assessment:     CCA Substance Use Alcohol/Drug Use: Alcohol / Drug Use Pain Medications: see MAR Prescriptions: see MAR Over the Counter: see MAR History of alcohol / drug use?: No  history of alcohol / drug abuse                         ASAM's:  Six Dimensions of Multidimensional Assessment  Dimension 1:  Acute Intoxication and/or Withdrawal Potential:      Dimension 2:  Biomedical Conditions and Complications:      Dimension 3:  Emotional, Behavioral, or Cognitive Conditions and Complications:     Dimension 4:  Readiness to Change:     Dimension 5:  Relapse, Continued use, or Continued Problem Potential:     Dimension 6:  Recovery/Living Environment:     ASAM Severity Score:    ASAM Recommended Level of Treatment:     Substance use Disorder (SUD)    Recommendations for Services/Supports/Treatments:    Discharge Disposition:    DSM5 Diagnoses: Patient Active Problem List   Diagnosis Date Noted   Psychosis ([place])    Prostate cancer ([place]) 10/17/2016     Referrals to Alternative Service(s): Referred to Alternative Service(s):   Place:   Date:   Time:    Referred to Alternative Service(s):   Place:   Date:   Time:    Referred to Alternative Service(s):   Place:   Date:   Time:    Referred to Alternative Service(s):   Place:   Date:   Time:     Azadeh Hyder J Odette Watanabe, LCAS

## 2021-04-05 NOTE — ED Notes (Signed)
Pending transfer to ED for Medical Clearance as per NP [name].

## 2021-04-05 NOTE — ED Notes (Signed)
Patient was admitted to continuous assessment. Patient has been cooperative since arriving to the unit. Patient denies SI/HI and AVH. Patient was escorted and transported by his wife and two sons. Patient denied pain. Patient is resting quietly on the unit.

## 2021-04-05 NOTE — Progress Notes (Signed)
Patient was referred to the [facility] by Dr. Casimiro Needle.  He states that has seen [name] 2-4 times in the past. He states that he started seeing Dr. Casimiro Needle because of some issues at work. He states that people at work have lost confidence in him and he states that he has made some work related mistakes. Wife states that he had a big work transition that caused him to be very stressed out to the point that he was not sleeping well, eating and lots of anxiety.  Wife states that patient are out to get him and that there are survalience cameras in his home.  Patient was started on Zoloft and the paranoia went away.  He recently started remeron and the paranoia returned. No Si/HI and no previous attempts.  Patient was prescribed Abilify, but refused to take it. Patient was given an injection yesterday, but he is not sure what medication he was given.  Patient is very paranoid and suspisious.  Patient denies the use of any drugs or alcohol.

## 2021-04-05 NOTE — Discharge Instructions (Signed)
Transfer to [facility] ED for Head CT

## 2021-04-05 NOTE — ED Notes (Signed)
Received verbal report from Paige P RN at this time 

## 2021-04-05 NOTE — ED Provider Notes (Signed)
Behavioral Health Admission [facility]&P [facility] OBS)  Date: 04/05/21 Patient Name: Joshua Lloyd MRN: [id number] Chief Complaint: No chief complaint on file.     Diagnoses:  Final diagnoses:  Paranoia [facility])    HPI: Patient presents voluntarily to [facility] for walk-in assessment. He is accompanied by his wife, Joshua Lloyd, who remains present during assessment.   Joshua Lloyd has recently been treated for sleeplessness and anxiety following a transition at his workplace that occurred approximately 12 weeks ago. He is followed by Dr Joshua Lloyd for recent outpatient psychiatry concerns.  In August, 2022 he was prescribed Zoloft, felt that "men were looking into the house."  Zoloft stopped immediately. 6 weeks ago he was prescribed clonazepam, he has been compliant with 0.5 mg nightly to assist with sleep.  Sleeps approximately 7 hours per night, sleep is interrupted. 2 weeks ago he was prescribed Remeron, he has been compliant with this medication. 8 days ago he was prescribed Abilify, he only took only 1 dose of Abilify as he was concerned this medication was "too strong."  On yesterday he received Abilify maintaina 400 mg IM at Dr. Karen Lloyd office related to his paranoia.  He is followed by primary care provider, Dr Joshua Lloyd. Completed MRI head approx one month ago, no significant findings per patient. Patient and wife deny psychiatric concerns prior to approx. 12 weeks ago.  He has experienced paranoia x approx.  2 weeks.  On last night at 1130pm he received a text to his cell phone, told his wife "they are here, today is the day, get dressed because it will be cold where we are going."  Also over the last 2 weeks he believes that he is being monitored by "high tech technology" in his home, because "someone thinks I have done something wrong."  Per patient and his wife he has lost approximately 13 pounds since August 2022, he paces and "never thinks to eat food." He typically walks for  exercise near his home but has refused to walk for several weeks states "people follow [place], the neighbors know, they know the feds are coming to get [place]." Recently he has slept with his clothing on and wallet and keys in his pockets related to paranoia. Patient's wife and two adult sons express concern for patient's bizarre behavior.  Patient is assessed face-to-face by nurse practitioner.  He is seated in assessment area, no acute distress.  He is alert and oriented, pleasant and cooperative during assessment.  He presents with anxious mood. He denies suicidal and homicidal ideations.  He denies any history of suicide attempts, denies history of self-harm.  He contracts verbally for safety with this Probation officer.   He has normal speech.  He denies both auditory and visual hallucinations.  Patient appears preoccupied with paranoid delusions. He states "now this (assessment) will be part of the public record!"    Patient resides in [place] with his wife, denies access to weapons.  He is employed in [employer].  He denies alcohol and substance use.  Patient offered support and encouragement. Patient's wife, Joshua Lloyd agrees. Briefly updated patient's sons, Joshua Lloyd and Joshua Lloyd regarding treatment plan with patients consent. Patient's wife and sons would like to be updated.   Patient gives verbal consent to speak with Dr Joshua Lloyd who confirms that Joshua Lloyd received Abilify maintaina 400 mg IM on yesterday, 04/04/2021 related to paranoia.  PHQ 2-9:     Total Time spent with patient: 30 minutes  Musculoskeletal  Strength & Muscle Tone: within  normal limits Gait & Station: normal Patient leans: N/A  Psychiatric Specialty Exam  Presentation General Appearance: Appropriate for Environment; Casual Eye Contact:Good Speech:Clear and Coherent; Normal Rate Speech Volume:Normal Handedness:Right  Mood and Affect  Mood:Anxious Affect:Appropriate; Congruent  Thought Process  Thought  Processes:Coherent; Goal Directed Descriptions of Associations:Tangential Orientation:Full (Time, Place and Person) Thought Content:Tangential   Hallucinations:Hallucinations: None Ideas of Reference:Paranoia Suicidal Thoughts:Suicidal Thoughts: No Homicidal Thoughts:Homicidal Thoughts: No  Sensorium  Memory:Immediate Good; Recent Good; Remote Good Judgment:Fair Insight:Fair  Executive Functions  Concentration:Fair Attention Span:Good [place] of Knowledge:Good Language:Good  Psychomotor Activity  Psychomotor Activity:Psychomotor Activity: Normal  Assets  Assets:Communication Skills; Desire for Improvement; Financial Resources/Insurance; Housing; Intimacy; Leisure Time; Physical Health; Resilience; Social Support; Talents/Skills; Transportation; Vocational/Educational  Sleep  Sleep:Sleep: Fair  Nutritional Assessment (For OBS and FBC admissions only) Has the patient had a weight loss or gain of 10 pounds or more in the last 3 months?: Yes Has the patient had a decrease in food intake/or appetite?: Yes Does the patient have dental problems?: No Does the patient have eating habits or behaviors that may be indicators of an eating disorder including binging or inducing vomiting?: No Has the patient recently lost weight without trying?: 1 Has the patient been eating poorly because of a decreased appetite?: 0 Malnutrition Screening Tool Score: 1   Physical Exam Vitals and nursing note reviewed.  Constitutional:      Appearance: Normal appearance. He is well-developed and normal weight.  HENT:     Head: Normocephalic and atraumatic.     Nose: Nose normal.  Cardiovascular:     Rate and Rhythm: Normal rate.  Pulmonary:     Effort: Pulmonary effort is normal.  Musculoskeletal:        General: Normal range of motion.     Cervical back: Normal range of motion.  Skin:    General: Skin is warm and dry.  Neurological:     Mental Status: He is alert and oriented to  person, place, and time.  Psychiatric:        Attention and Perception: Attention normal.        Mood and Affect: Mood is anxious.        Speech: Speech is tangential.        Behavior: Behavior is cooperative.        Thought Content: Thought content is paranoid and delusional.        Cognition and Memory: Cognition and memory normal.        Judgment: Judgment normal.   Review of Systems  Constitutional: Negative.   HENT: Negative.    Eyes: Negative.   Respiratory: Negative.    Cardiovascular: Negative.   Gastrointestinal: Negative.   Genitourinary: Negative.   Musculoskeletal: Negative.   Skin: Negative.   Neurological: Negative.   Endo/Heme/Allergies: Negative.   Psychiatric/Behavioral:  The patient is nervous/anxious.    Blood pressure 114/82, pulse 98, temperature 97.6 F (36.4 C), temperature source Oral, resp. rate 16, SpO2 97 %. There is no height or weight on file to calculate BMI.  Past Psychiatric History: none reported  Is the patient at risk to self? No  Has the patient been a risk to self in the past 6 months? No .    Has the patient been a risk to self within the distant past? No   Is the patient a risk to others? No   Has the patient been a risk to others in the past 6 months? No   Has the  patient been a risk to others within the distant past? No   Past Medical History:  Past Medical History:  Diagnosis Date   Cancer [facility])    prostate    Past Surgical History:  Procedure Laterality Date   NO PAST SURGERIES     ROBOT ASSISTED LAPAROSCOPIC RADICAL PROSTATECTOMY N/A 10/17/2016   Procedure: ROBOTIC ASSISTED LAPAROSCOPIC RADICAL PROSTATECTOMY LEVEL 2/ BILATERAL PELVIC LYMPHADENECTOMY;  Surgeon: Raynelle Bring, MD;  Location: [facility];  Service: Urology;  Laterality: N/A;   WRIST SURGERY     skiing accident when 60 years old    Family History: No family history on file.  Social History:  Social History   Socioeconomic History   Marital status: Married     Spouse name: Not on file   Number of children: Not on file   Years of education: Not on file   Highest education level: Not on file  Occupational History   Not on file  Tobacco Use   Smoking status: Never   Smokeless tobacco: Never  Substance and Sexual Activity   Alcohol use: Yes    Alcohol/week: 1.0 standard drink    Types: 1 Glasses of wine per week    Comment: ocassionally   Drug use: No   Sexual activity: Yes  Other Topics Concern   Not on file  Social History Narrative   Not on file   Social Determinants of Health   Financial Resource Strain: Not on file  Food Insecurity: Not on file  Transportation Needs: Not on file  Physical Activity: Not on file  Stress: Not on file  Social Connections: Not on file  Intimate Partner Violence: Not on file    SDOH:  SDOH Screenings   Alcohol Screen: Not on file  Depression (PHQ2-9): Not on file  Financial Resource Strain: Not on file  Food Insecurity: Not on file  Housing: Not on file  Physical Activity: Not on file  Social Connections: Not on file  Stress: Not on file  Tobacco Use: Low Risk    Smoking Tobacco Use: Never   Smokeless Tobacco Use: Never  Transportation Needs: Not on file    Last Labs:  No visits with results within 6 Month(s) from this visit.  Latest known visit with results is:  Lab on 07/26/2019  Component Date Value Ref Range Status   SARS-CoV-2, NAA 07/26/2019 Detected (A) Not Detected Final   Comment: This nucleic acid amplification test was developed and its performance characteristics determined by [employer]. Nucleic acid amplification tests include RT-PCR and TMA. This test has not been FDA cleared or approved. This test has been authorized by FDA under an Emergency Use Authorization (EUA). This test is only authorized for the duration of time the declaration that circumstances exist justifying the authorization of the emergency use of in vitro diagnostic tests for detection of  SARS-CoV-2 virus and/or diagnosis of COVID-19 infection under section 564(b)(1) of the Act, 21 U.S.C. 856DJS-9(F) (1), unless the authorization is terminated or revoked sooner. When diagnostic testing is negative, the possibility of a false negative result should be considered in the context of a patient's recent exposures and the presence of clinical signs and symptoms consistent with COVID-19. An individual without symptoms of COVID-19 and who is not shedding SARS-CoV-2 virus wo                          uld expect to have a negative (not detected) result in this assay.  Allergies: Prednisone  PTA Medications: (Not in a hospital admission)   Medical Decision Making  Patient reviewed with Dr Serafina Mitchell. Inpatient psychiatric admission recommended. Involuntary commitment petition completed by this Probation officer.  Medications: -Clonazepam 0.5 mg QHS -Clonazepam 0.5mg  daily PRN/ anxiety -Abilify 5mg  daily    Recommendations  Based on my evaluation the patient appears to have an emergency medical condition for which I recommend the patient be transferred to the emergency department for further evaluation. Patient requires CT head. Spoke with Dr Britt Boozer emergency department, patient plan includes return to [facility] to await inpatient psychiatric hospitalization once head CT complete.  Lucky Rathke, FNP 04/05/21  5:11 PM

## 2021-04-05 NOTE — ED Notes (Signed)
[facility] transport requested to serve and transport pt to [facility]/ Dr Ashok Cordia accepting.

## 2021-04-05 NOTE — ED Provider Notes (Signed)
[facility] EMERGENCY DEPARTMENT Provider Note   CSN: [id number] Arrival date & time: 04/05/21  2152     History Chief Complaint  Patient presents with   [facility]     Joshua Lloyd is a 60 y.o. male.  Patient with hx anxiety, insomnia in past few months, sent from [facility] for [facility], with plan to return there for inpatient psychiatric care and placement due to paranoid delusional disorder and anxiety. Patient poor historian - level 5 caveat. Pt denies specific physical c/o. No fever. No chest pain. No abd pain. No sob. No headache. Does note 10-15 lb wt loss in past few months - states poor po intake.   The history is provided by the patient, medical records and the police. The history is limited by the condition of the patient.      Past Medical History:  Diagnosis Date   Cancer [facility])    prostate    Patient Active Problem List   Diagnosis Date Noted   Psychosis [facility])    Prostate cancer ([place]) 10/17/2016    Past Surgical History:  Procedure Laterality Date   NO PAST SURGERIES     ROBOT ASSISTED LAPAROSCOPIC RADICAL PROSTATECTOMY N/A 10/17/2016   Procedure: ROBOTIC ASSISTED LAPAROSCOPIC RADICAL PROSTATECTOMY LEVEL 2/ BILATERAL PELVIC LYMPHADENECTOMY;  Surgeon: Raynelle Bring, MD;  Location: [facility];  Service: Urology;  Laterality: N/A;   WRIST SURGERY     skiing accident when 60 years old       History reviewed. No pertinent family history.  Social History   Tobacco Use   Smoking status: Never   Smokeless tobacco: Never  Substance Use Topics   Alcohol use: Yes    Alcohol/week: 1.0 standard drink    Types: 1 Glasses of wine per week    Comment: ocassionally   Drug use: No    Home Medications Prior to Admission medications   Medication Sig Start Date End Date Taking? Authorizing Provider  docusate sodium (COLACE) 100 MG capsule Take 1 capsule (100 mg total) by mouth 2 (two) times daily. 10/18/16   Raynelle Bring, MD   HYDROcodone-acetaminophen (NORCO/VICODIN) 5-325 MG tablet Take 1-2 tablets by mouth every 6 (six) hours as needed for moderate pain (pain). 10/18/16   Raynelle Bring, MD  sulfamethoxazole-trimethoprim (BACTRIM DS,SEPTRA DS) 800-160 MG tablet Take 1 tablet by mouth 2 (two) times daily. 10/18/16   Raynelle Bring, MD    Allergies    Prednisone  Review of Systems   Review of Systems  Constitutional:  Negative for fever.  HENT:  Negative for sore throat.   Eyes:  Negative for visual disturbance.  Respiratory:  Negative for cough and shortness of breath.   Cardiovascular:  Negative for chest pain.  Gastrointestinal:  Negative for abdominal pain and vomiting.  Genitourinary:  Negative for flank pain.  Musculoskeletal:  Negative for back pain, neck pain and neck stiffness.  Skin:  Negative for rash.  Neurological:  Negative for headaches.  Hematological:  Does not bruise/bleed easily.  Psychiatric/Behavioral:  Positive for sleep disturbance. Negative for suicidal ideas. The patient is nervous/anxious.    Physical Exam Updated Vital Signs There were no vitals taken for this visit.  Physical Exam Vitals and nursing note reviewed.  Constitutional:      Appearance: Normal appearance. He is well-developed.  HENT:     Head: Atraumatic.     Nose: Nose normal.     Mouth/Throat:     Mouth: Mucous membranes are moist.  Pharynx: Oropharynx is clear.  Eyes:     General: No scleral icterus.    Conjunctiva/sclera: Conjunctivae normal.     Pupils: Pupils are equal, round, and reactive to light.  Neck:     Trachea: No tracheal deviation.     Comments: Thyroid not grossly enlarged or tender.  No stiffness or rigidity.  Cardiovascular:     Rate and Rhythm: Normal rate and regular rhythm.     Pulses: Normal pulses.     Heart sounds: Normal heart sounds. No murmur heard.   No friction rub. No gallop.  Pulmonary:     Effort: Pulmonary effort is normal. No accessory muscle usage or respiratory  distress.     Breath sounds: Normal breath sounds.  Abdominal:     General: Bowel sounds are normal. There is no distension.     Palpations: Abdomen is soft. There is no mass.     Tenderness: There is no abdominal tenderness. There is no guarding.  Genitourinary:    Comments: No cva tenderness. Musculoskeletal:        General: No swelling.     Cervical back: Normal range of motion and neck supple. No rigidity.  Skin:    General: Skin is warm and dry.     Findings: No rash.  Neurological:     Mental Status: He is alert.     Comments: Alert, speech clear. No dysarthria or aphasia. Motor/sens grossly intact bil. Steady gait.   Psychiatric:     Comments: Anxious appearing. Pt appears guarded, states his actions will ruin the lives of everyone including family, but will not explain what he is talking about. Denies thoughts of harming self or others.     ED Results / Procedures / Treatments   Labs (all labs ordered are listed, but only abnormal results are displayed) Results for orders placed or performed during the hospital encounter of 04/05/21  Resp Panel by RT-PCR (Flu A&B, Covid) Nasopharyngeal Swab   Specimen: Nasopharyngeal Swab; Nasopharyngeal(NP) swabs in vial transport medium  Result Value Ref Range   SARS Coronavirus 2 by RT PCR NEGATIVE NEGATIVE   Influenza A by PCR NEGATIVE NEGATIVE   Influenza B by PCR NEGATIVE NEGATIVE  CBC with Differential/Platelet  Result Value Ref Range   WBC 8.1 4.0 - 10.5 K/uL   RBC 4.96 4.22 - 5.81 MIL/uL   Hemoglobin 16.5 13.0 - 17.0 g/dL   HCT 48.4 39.0 - 52.0 %   MCV 97.6 80.0 - 100.0 fL   MCH 33.3 26.0 - 34.0 pg   MCHC 34.1 30.0 - 36.0 g/dL   RDW 12.1 11.5 - 15.5 %   Platelets 226 150 - 400 K/uL   nRBC 0.0 0.0 - 0.2 %   Neutrophils Relative % 68 %   Neutro Abs 5.5 1.7 - 7.7 K/uL   Lymphocytes Relative 20 %   Lymphs Abs 1.7 0.7 - 4.0 K/uL   Monocytes Relative 10 %   Monocytes Absolute 0.8 0.1 - 1.0 K/uL   Eosinophils Relative 1 %    Eosinophils Absolute 0.1 0.0 - 0.5 K/uL   Basophils Relative 1 %   Basophils Absolute 0.1 0.0 - 0.1 K/uL   Immature Granulocytes 0 %   Abs Immature Granulocytes 0.03 0.00 - 0.07 K/uL  Comprehensive metabolic panel  Result Value Ref Range   Sodium 138 135 - 145 mmol/L   Potassium 4.7 3.5 - 5.1 mmol/L   Chloride 102 98 - 111 mmol/L   CO2 28 22 - 32  mmol/L   Glucose, Bld 92 70 - 99 mg/dL   BUN 16 8 - 23 mg/dL   Creatinine, Ser 1.10 0.61 - 1.24 mg/dL   Calcium 9.7 8.9 - 10.3 mg/dL   Total Protein 7.0 6.5 - 8.1 g/dL   Albumin 4.3 3.5 - 5.0 g/dL   AST 30 15 - 41 U/L   ALT 16 0 - 44 U/L   Alkaline Phosphatase 82 38 - 126 U/L   Total Bilirubin 0.6 0.3 - 1.2 mg/dL   GFR, Estimated >60 >60 mL/min   Anion gap 8 5 - 15  Hemoglobin A1c  Result Value Ref Range   Hgb A1c MFr Bld 5.7 (H) 4.8 - 5.6 %   Mean Plasma Glucose 116.89 mg/dL  Ethanol  Result Value Ref Range   Alcohol, Ethyl (B) <10 <10 mg/dL  Lipid panel  Result Value Ref Range   Cholesterol 180 0 - 200 mg/dL   Triglycerides 119 <150 mg/dL   HDL 59 >40 mg/dL   Total CHOL/HDL Ratio 3.1 RATIO   VLDL 24 0 - 40 mg/dL   LDL Cholesterol 97 0 - 99 mg/dL  Magnesium  Result Value Ref Range   Magnesium 2.1 1.7 - 2.4 mg/dL  TSH  Result Value Ref Range   TSH 1.374 0.350 - 4.500 uIU/mL  POCT Urine Drug Screen - ([facility])  Result Value Ref Range   POC Amphetamine UR None Detected NONE DETECTED (Cut Off Level 1000 ng/mL)   POC Secobarbital (BAR) None Detected NONE DETECTED (Cut Off Level 300 ng/mL)   POC Buprenorphine (BUP) None Detected NONE DETECTED (Cut Off Level 10 ng/mL)   POC Oxazepam (BZO) None Detected NONE DETECTED (Cut Off Level 300 ng/mL)   POC Cocaine UR None Detected NONE DETECTED (Cut Off Level 300 ng/mL)   POC Methamphetamine UR None Detected NONE DETECTED (Cut Off Level 1000 ng/mL)   POC Morphine None Detected NONE DETECTED (Cut Off Level 300 ng/mL)   POC Oxycodone UR None Detected NONE DETECTED (Cut Off Level 100 ng/mL)    POC Methadone UR None Detected NONE DETECTED (Cut Off Level 300 ng/mL)   POC Marijuana UR None Detected NONE DETECTED (Cut Off Level 50 ng/mL)  POC SARS Coronavirus 2 Ag-ED - Nasal Swab  Result Value Ref Range   SARS Coronavirus 2 Ag Negative Negative   CT HEAD WO CONTRAST (5MM)  Result Date: 04/05/2021 CLINICAL DATA:  Mental status change of unknown cause EXAM: CT HEAD WITHOUT CONTRAST TECHNIQUE: Contiguous axial images were obtained from the base of the skull through the vertex without intravenous contrast. COMPARISON:  MRI brain 03/02/2021 FINDINGS: Brain: No evidence of acute infarction, hemorrhage, hydrocephalus, extra-axial collection or mass lesion/mass effect. Vascular: No hyperdense vessel or unexpected calcification. Skull: Calvarium appears intact. Sinuses/Orbits: Small retention cyst in the right maxillary antrum. Paranasal sinuses and mastoid air cells are otherwise clear. Other: None. IMPRESSION: No acute intracranial abnormalities. Electronically Signed   By: Lucienne Capers M.D.   On: 04/05/2021 22:28    EKG None  Radiology CT HEAD WO CONTRAST (5MM)  Result Date: 04/05/2021 CLINICAL DATA:  Mental status change of unknown cause EXAM: CT HEAD WITHOUT CONTRAST TECHNIQUE: Contiguous axial images were obtained from the base of the skull through the vertex without intravenous contrast. COMPARISON:  MRI brain 03/02/2021 FINDINGS: Brain: No evidence of acute infarction, hemorrhage, hydrocephalus, extra-axial collection or mass lesion/mass effect. Vascular: No hyperdense vessel or unexpected calcification. Skull: Calvarium appears intact. Sinuses/Orbits: Small retention cyst in the right maxillary antrum.  Paranasal sinuses and mastoid air cells are otherwise clear. Other: None. IMPRESSION: No acute intracranial abnormalities. Electronically Signed   By: Lucienne Capers M.D.   On: 04/05/2021 22:28    Procedures Procedures   Medications Ordered in ED Medications - No data to  display  ED Course  I have reviewed the triage vital signs and the nursing notes.  Pertinent labs & imaging results that were available during my care of the patient were reviewed by me and considered in my medical decision making (see chart for details).    MDM Rules/Calculators/A&P                          Pt sent from [facility] for CT scan with plan to return there for inpatient psychiatric treatment/placement.   Reviewed nursing notes and prior charts for additional history. Labs at [facility] reviewed/interpreted by me - wbc normal, chem normal.  CT reviewed/interpreted by me - no hem.   Pt appears stable for transport back to [place], ivc'd, by [employer].      Final Clinical Impression(s) / ED Diagnoses Final diagnoses:  Paranoid ideation ([place])  Delusional thoughts [facility])  Anxiety    Rx / DC Orders ED Discharge Orders     None        Lajean Saver, MD 04/05/21 2250

## 2021-04-05 NOTE — ED Triage Notes (Signed)
Pt transported her from [facility] via [facility] for a CT scan, is expected to transport back to [facility] after scan is resulted.

## 2021-04-05 NOTE — Discharge Instructions (Addendum)
Please have [employer] take patient back to [facility].

## 2021-04-06 ENCOUNTER — Ambulatory Visit (HOSPITAL_COMMUNITY)
Admission: EM | Admit: 2021-04-06 | Discharge: 2021-04-06 | Disposition: A | Discharge status: Other Acute Inpatient Hospital | Payer: BC Managed Care – PPO | Attending: Student | Admitting: Student

## 2021-04-06 ENCOUNTER — Inpatient Hospital Stay (HOSPITAL_COMMUNITY)
Admission: AD | Admit: 2021-04-06 | Discharge: 2021-04-14 | DRG: 885 | Disposition: A | Discharge status: Home / Self Care | Payer: BC Managed Care – PPO | Attending: Emergency Medicine | Admitting: Emergency Medicine

## 2021-04-06 ENCOUNTER — Other Ambulatory Visit: Payer: Self-pay

## 2021-04-06 ENCOUNTER — Encounter (HOSPITAL_COMMUNITY): Payer: Self-pay | Admitting: Emergency Medicine

## 2021-04-06 DIAGNOSIS — R4689 Other symptoms and signs involving appearance and behavior: Secondary | ICD-10-CM | POA: Diagnosis present

## 2021-04-06 DIAGNOSIS — R4189 Other symptoms and signs involving cognitive functions and awareness: Secondary | ICD-10-CM | POA: Diagnosis present

## 2021-04-06 DIAGNOSIS — Z20822 Contact with and (suspected) exposure to covid-19: Secondary | ICD-10-CM | POA: Diagnosis present

## 2021-04-06 DIAGNOSIS — G4700 Insomnia, unspecified: Secondary | ICD-10-CM | POA: Diagnosis present

## 2021-04-06 DIAGNOSIS — F323 Major depressive disorder, single episode, severe with psychotic features: Principal | ICD-10-CM | POA: Diagnosis present

## 2021-04-06 DIAGNOSIS — F419 Anxiety disorder, unspecified: Secondary | ICD-10-CM | POA: Diagnosis present

## 2021-04-06 DIAGNOSIS — E559 Vitamin D deficiency, unspecified: Secondary | ICD-10-CM | POA: Diagnosis present

## 2021-04-06 DIAGNOSIS — Z23 Encounter for immunization: Secondary | ICD-10-CM

## 2021-04-06 DIAGNOSIS — Z820 Family history of epilepsy and other diseases of the nervous system: Secondary | ICD-10-CM

## 2021-04-06 DIAGNOSIS — F29 Unspecified psychosis not due to a substance or known physiological condition: Secondary | ICD-10-CM

## 2021-04-06 DIAGNOSIS — E538 Deficiency of other specified B group vitamins: Secondary | ICD-10-CM | POA: Diagnosis present

## 2021-04-06 MED ORDER — ACETAMINOPHEN 325 MG PO TABS
650.0000 mg | ORAL_TABLET | Freq: Four times a day (QID) | ORAL | Status: DC | PRN
Start: 2021-04-06 — End: 2021-04-14

## 2021-04-06 MED ORDER — CLONAZEPAM 0.5 MG PO TABS
0.5000 mg | ORAL_TABLET | Freq: Every day | ORAL | Status: DC | PRN
  Administered 2021-04-06: 0.5 mg via ORAL
  Filled 2021-04-06: qty 1

## 2021-04-06 MED ORDER — ALUM & MAG HYDROXIDE-SIMETH 200-200-20 MG/5ML PO SUSP: 30.0000 mL | ORAL | Status: DC | PRN

## 2021-04-06 MED ORDER — ACETAMINOPHEN 325 MG PO TABS: 650.0000 mg | ORAL_TABLET | Freq: Four times a day (QID) | ORAL | Status: DC | PRN

## 2021-04-06 MED ORDER — MAGNESIUM HYDROXIDE 400 MG/5ML PO SUSP: 30.0000 mL | Freq: Every day | ORAL | Status: DC | PRN

## 2021-04-06 MED ORDER — HYDROXYZINE HCL 25 MG PO TABS: 25.0000 mg | ORAL_TABLET | Freq: Three times a day (TID) | ORAL | Status: DC | PRN

## 2021-04-06 MED ORDER — CLONAZEPAM 0.5 MG PO TABS: 0.5000 mg | ORAL_TABLET | Freq: Every day | ORAL | Status: DC

## 2021-04-06 MED ORDER — CLONAZEPAM 0.5 MG PO TABS: 0.5000 mg | ORAL_TABLET | Freq: Every day | ORAL | Status: DC | PRN

## 2021-04-06 MED ORDER — INFLUENZA VAC SPLIT QUAD 0.5 ML IM SUSY
0.5000 mL | PREFILLED_SYRINGE | INTRAMUSCULAR | Status: AC
  Administered 2021-04-07: 0.5 mL via INTRAMUSCULAR
  Filled 2021-04-06: qty 0.5

## 2021-04-06 MED ORDER — ARIPIPRAZOLE 5 MG PO TABS: 5.0000 mg | ORAL_TABLET | Freq: Every day | ORAL | Status: DC

## 2021-04-06 MED ORDER — ARIPIPRAZOLE 5 MG PO TABS
5.0000 mg | ORAL_TABLET | Freq: Every day | ORAL | Status: DC
  Administered 2021-04-08: 5 mg via ORAL
  Filled 2021-04-06 (×6): qty 1

## 2021-04-06 MED ORDER — CLONAZEPAM 0.5 MG PO TABS
0.5000 mg | ORAL_TABLET | Freq: Every day | ORAL | Status: DC
  Administered 2021-04-06 – 2021-04-07 (×2): 0.5 mg via ORAL
  Filled 2021-04-06 (×2): qty 1

## 2021-04-06 NOTE — Progress Notes (Signed)
   04/06/21 1135  Vital Signs  Temp 98.4 F (36.9 C)  Temp Source Oral  Pulse Rate 97  Pulse Rate Source Dinamap  Resp 17  BP 111/87  BP Location Left Arm  BP Method Automatic  Patient Position (if appropriate) Sitting  Oxygen Therapy  SpO2 100 %  O2 Device Room Air  Pain Assessment  Pain Scale 0-10  Pain Score 0  Height and Weight  Height 6\' 1"  (1.854 m)  Weight 78.5 kg  BSA (Calculated - sq m) 2.01 sq meters  BMI (Calculated) 22.83  Weight in (lb) to have BMI = 25 189.1   D: Patient is a 60 y.o Caucasian male IVC'd by wife for delusions, paranoia,increased anxiety and insomnia. Pt believes somebody is coming into the house. Pt. Denies SI/HI/AVH. Pt. Reports a past DX of Anxiety. Pt. Reported that he has taken Zoloft in the past but it's a "no" for him. Pt. Stated that he wanted talk about the medicines. Pt. Reported that he had surgery to remove prostate cancer 3-4 years ago. Pt had pink skin with a slightly raised rash on chest, pt. Claims is from being "hot" from wearing multiple layers. Pt. Has 2 patchy gray/ white  "moles" on lower back Pt. Denies tobacco, alcohol and drug use. Pt. Sees a primary doctor, Dr. Lavone Orn at [place] on [address].  A:   Support and encouragement provided. Routine safety checks conducted every 15 minutes. Patient  Informed to notify staff with any concerns.   R: Safety maintained.

## 2021-04-06 NOTE — ED Notes (Signed)
Pt is in bed sleeping. Respirations are even and unlabored. No distress noted.Will continue to monitor for safety.

## 2021-04-06 NOTE — ED Provider Notes (Addendum)
Behavioral Health Admission H&P [facility] & OBS)  Date: 04/06/21 Patient Name: Joshua Lloyd MRN: [id number] Chief Complaint:  Chief Complaint  Patient presents with   IVC    Paranoid Delusions      Diagnoses:  Final diagnoses:  Psychosis, unspecified psychosis type [facility])    HPI: Patient returns to [facility] from [facility] ED after being sent from [facility] to ED for Head CT. Patient presented to the [facility] yesterday on 04/05/2021 accompanied by his wife for walk-in evaluation for paranoia.  At that time, patient was evaluated by TTS and [facility] provider and inpatient psychiatric treatment was recommended for the patient at that time.  Patient was also placed under IVC by [facility] provider at that time as well.  Review of previous 04/05/2021 [place] evaluation/admission shows that patient was started on Abilify 5 mg p.o. daily, Klonopin 0.5 mg nightly, and Klonopin 0.5 mg p.o. daily as needed for anxiety by [facility] provider at that time. Patient was then transferred to [facility] emergency department for head CT with plan for patient to return to [facility] to await placement for inpatient psychiatric treatment once head CT was done/once patient was medically cleared in the ED.   Per HPI from Joshua Lloyd, [name]) 04/05/21 note/evaluation of patient:   "Patient presents voluntarily to [facility] for walk-in assessment. He is accompanied by his wife, Joshua Lloyd, who remains present during assessment.    Joshua Lloyd has recently been treated for sleeplessness and anxiety following a transition at his workplace that occurred approximately 12 weeks ago. He is followed by Joshua Joshua Lloyd for recent outpatient psychiatry concerns.  In August, 2022 he was prescribed Zoloft, felt that "men were looking into the house."  Zoloft stopped immediately. 6 weeks ago he was prescribed clonazepam, he has been compliant with 0.5 mg nightly to assist with sleep.  Sleeps approximately 7 hours per night, sleep is interrupted. 2 weeks  ago he was prescribed Remeron, he has been compliant with this medication. 8 days ago he was prescribed Abilify, he only took only 1 dose of Abilify as he was concerned this medication was "too strong."  On yesterday he received Abilify maintaina 400 mg IM at Joshua. Karen Chafe office related to his paranoia.   He is followed by primary care provider, Joshua Joshua Lloyd. Completed MRI head approx one month ago, no significant findings per patient. Patient and wife deny psychiatric concerns prior to approx. 12 weeks ago.   He has experienced paranoia x approx.  2 weeks.  On last night at 1130pm he received a text to his cell phone, told his wife "they are here, today is the day, get dressed because it will be cold where we are going."  Also over the last 2 weeks he believes that he is being monitored by "high tech technology" in his home, because "someone thinks I have done something wrong."  Per patient and his wife he has lost approximately 13 pounds since August 2022, he paces and "never thinks to eat food." He typically walks for exercise near his home but has refused to walk for several weeks states "people follow [place], the neighbors know, they know the feds are coming to get [place]." Recently he has slept with his clothing on and wallet and keys in his pockets related to paranoia. Patient's wife and two adult sons express concern for patient's bizarre behavior.   Patient is assessed face-to-face by nurse practitioner.  He is seated in assessment area, no acute distress.  He is  alert and oriented, pleasant and cooperative during assessment.  He presents with anxious mood. He denies suicidal and homicidal ideations.  He denies any history of suicide attempts, denies history of self-harm.  He contracts verbally for safety with this Probation officer.   He has normal speech.  He denies both auditory and visual hallucinations.  Patient appears preoccupied with paranoid delusions. He states "now this (assessment) will be part of the  public record!"     Patient resides in [place] with his wife, denies access to weapons.  He is employed in [employer].  He denies alcohol and substance use.   Patient offered support and encouragement. Patient's wife, Joshua Lloyd agrees. Briefly updated patient's sons, Joshua Lloyd and Joshua Lloyd regarding treatment plan with patients consent. Patient's wife and sons would like to be updated.    Patient gives verbal consent to speak with Joshua Joshua Lloyd who confirms that Joshua Lloyd received Abilify maintaina 400 mg IM on yesterday, 04/04/2021 related to paranoia."  Patient's 04/05/2021 head CT without contrast showed the following overall impression of "No acute intracranial abnormalities".  Per chart review, patient also had brain MRI without contrast on 03/02/2021 which showed the overall following impression of "normal brain MRI for patient age.  No acute abnormality."   Patient has returned back to [facility] following head CT in the ED to await placement for inpatient psychiatric treatment. Per [place], patient has been accepted to [facility] for inpatient psychiatric treatment and may arrive to [facility] after 0800 on 04/06/21.   Patient seen and examined by myself upon his return to [facility] from the ED. On exam at this time, patient reports he is feeling well, denies SI. He denies HI, AVH, or paranoia at this time. Per chart review, it appears that patient has had some recent prescriptions filled. However, patient is unable to tell me what home medications he is taking at this time and appears to be confused regarding his home medications.   PHQ 2-9:  [place] ED from 04/05/2021 in [facility]  Thoughts that you would be better off dead, or of hurting yourself in some way Not at all  PHQ-9 Total Score 14       [place] ED from 04/05/2021 in [place] No Risk        Total Time spent with patient: 30 minutes  Musculoskeletal   Strength & Muscle Tone: within normal limits Gait & Station: normal Patient leans: N/A  Psychiatric Specialty Exam  Presentation General Appearance: Appropriate for Environment; Casual  Eye Contact:Good  Speech:Clear and Coherent; Normal Rate  Speech Volume:Decreased  Handedness:Right   Mood and Affect  Mood:Anxious  Affect:Congruent; Appropriate   Thought Process  Thought Processes:Coherent; Goal Directed  Descriptions of Associations:Tangential  Orientation:Full (Time, Place and Person)  Thought Content:Tangential; Paranoid Ideation  Diagnosis of Schizophrenia or Schizoaffective disorder in past: No  Duration of Psychotic Symptoms: Less than six months  Hallucinations:Hallucinations: None  Ideas of Reference:Paranoia  Suicidal Thoughts:Suicidal Thoughts: No  Homicidal Thoughts:Homicidal Thoughts: No   Sensorium  Memory:Immediate Fair; Recent Fair; Remote Fair  Judgment:Fair  Insight:Fair   Executive Functions  Concentration:Fair  Attention Span:Good  [place] of Knowledge:Good  Language:Good   Psychomotor Activity  Psychomotor Activity:Psychomotor Activity: Normal   Assets  Assets:Communication Skills; Desire for Improvement; Financial Resources/Insurance; Housing; Leisure Time; Physical Health; Resilience; Social Support; Talents/Skills; Transportation; Vocational/Educational   Sleep  Sleep:Sleep: Fair   Nutritional Assessment (For OBS and [facility] admissions  only) Has the patient had a weight loss or gain of 10 pounds or more in the last 3 months?: Yes Has the patient had a decrease in food intake/or appetite?: Yes Does the patient have dental problems?: No Does the patient have eating habits or behaviors that may be indicators of an eating disorder including binging or inducing vomiting?: No Has the patient recently lost weight without trying?: 1 Has the patient been eating poorly because of a decreased appetite?:  0 Malnutrition Screening Tool Score: 1   Physical Exam Constitutional:      General: He is not in acute distress.    Appearance: He is not ill-appearing, toxic-appearing or diaphoretic.  HENT:     Head: Normocephalic and atraumatic.     Right Ear: External ear normal.     Left Ear: External ear normal.     Nose: Nose normal.  Eyes:     General:        Right eye: No discharge.        Left eye: No discharge.     Conjunctiva/sclera: Conjunctivae normal.  Cardiovascular:     Rate and Rhythm: Normal rate.  Pulmonary:     Effort: Pulmonary effort is normal. No respiratory distress.  Musculoskeletal:        General: Normal range of motion.     Cervical back: Normal range of motion.  Neurological:     General: No focal deficit present.     Mental Status: He is alert and oriented to person, place, and time.     Comments: No tremor noted.   Psychiatric:        Mood and Affect: Mood is anxious.        Speech: Speech normal.        Behavior: Behavior is not agitated, slowed, aggressive, withdrawn, hyperactive or combative. Behavior is cooperative.        Thought Content: Thought content is paranoid and delusional. Thought content does not include homicidal or suicidal ideation.   Review of Systems  Constitutional:  Positive for weight loss. Negative for chills, diaphoresis, fever and malaise/fatigue.  HENT:  Negative for congestion.   Respiratory:  Negative for cough and shortness of breath.   Cardiovascular:  Negative for chest pain and palpitations.  Gastrointestinal:  Negative for abdominal pain, constipation, diarrhea, nausea and vomiting.  Musculoskeletal:  Negative for joint pain and myalgias.  Neurological:  Negative for dizziness and headaches.  Psychiatric/Behavioral:  The patient is nervous/anxious.   All other systems reviewed and are negative.  Vitals: Blood pressure (!) 136/93, pulse 90, temperature 98.1 F (36.7 C), temperature source Oral, resp. rate 18. There is no  height or weight on file to calculate BMI.  Past Psychiatric History: None reported.    Is the patient at risk to self? No  Has the patient been a risk to self in the past 6 months? No .    Has the patient been a risk to self within the distant past? No   Is the patient a risk to others? No   Has the patient been a risk to others in the past 6 months? No   Has the patient been a risk to others within the distant past? No   Past Medical History:  Past Medical History:  Diagnosis Date   Cancer [facility])    prostate    Past Surgical History:  Procedure Laterality Date   NO PAST SURGERIES     ROBOT ASSISTED LAPAROSCOPIC RADICAL PROSTATECTOMY N/A 10/17/2016  Procedure: ROBOTIC ASSISTED LAPAROSCOPIC RADICAL PROSTATECTOMY LEVEL 2/ BILATERAL PELVIC LYMPHADENECTOMY;  Surgeon: Raynelle Bring, MD;  Location: [facility];  Service: Urology;  Laterality: N/A;   WRIST SURGERY     skiing accident when 60 years old    Family History: No family history on file.  Social History:  Social History   Socioeconomic History   Marital status: Married    Spouse name: Not on file   Number of children: Not on file   Years of education: Not on file   Highest education level: Not on file  Occupational History   Not on file  Tobacco Use   Smoking status: Never   Smokeless tobacco: Never  Substance and Sexual Activity   Alcohol use: Yes    Alcohol/week: 1.0 standard drink    Types: 1 Glasses of wine per week    Comment: ocassionally   Drug use: No   Sexual activity: Yes  Other Topics Concern   Not on file  Social History Narrative   Not on file   Social Determinants of Health   Financial Resource Strain: Not on file  Food Insecurity: Not on file  Transportation Needs: Not on file  Physical Activity: Not on file  Stress: Not on file  Social Connections: Not on file  Intimate Partner Violence: Not on file    SDOH:  SDOH Screenings   Alcohol Screen: Not on file  Depression (PHQ2-9): Medium  Risk   PHQ-2 Score: 14  Financial Resource Strain: Not on file  Food Insecurity: Not on file  Housing: Not on file  Physical Activity: Not on file  Social Connections: Not on file  Stress: Not on file  Tobacco Use: Low Risk    Smoking Tobacco Use: Never   Smokeless Tobacco Use: Never  Transportation Needs: Not on file    Last Labs:  Admission on 04/05/2021, Discharged on 04/05/2021  Component Date Value Ref Range Status   SARS Coronavirus 2 by RT PCR 04/05/2021 NEGATIVE  NEGATIVE Final   Comment: (NOTE) SARS-CoV-2 target nucleic acids are NOT DETECTED.  The SARS-CoV-2 RNA is generally detectable in upper respiratory specimens during the acute phase of infection. The lowest concentration of SARS-CoV-2 viral copies this assay can detect is 138 copies/mL. A negative result does not preclude SARS-Cov-2 infection and should not be used as the sole basis for treatment or other patient management decisions. A negative result may occur with  improper specimen collection/handling, submission of specimen other than nasopharyngeal swab, presence of viral mutation(s) within the areas targeted by this assay, and inadequate number of viral copies(<138 copies/mL). A negative result must be combined with clinical observations, patient history, and epidemiological information. The expected result is Negative.  Fact Sheet for Patients:  EntrepreneurPulse.com.au  Fact Sheet for Healthcare Providers:  IncredibleEmployment.be  This test is no                          t yet approved or cleared by the [place] FDA and  has been authorized for detection and/or diagnosis of SARS-CoV-2 by FDA under an Emergency Use Authorization (EUA). This EUA will remain  in effect (meaning this test can be used) for the duration of the COVID-19 declaration under Section 564(b)(1) of the Act, 21 U.S.C.section 360bbb-3(b)(1), unless the authorization is terminated  or  revoked sooner.       Influenza A by PCR 04/05/2021 NEGATIVE  NEGATIVE Final   Influenza B by PCR 04/05/2021 NEGATIVE  NEGATIVE Final   Comment: (NOTE) The Xpert Xpress SARS-CoV-2/FLU/RSV plus assay is intended as an aid in the diagnosis of influenza from Nasopharyngeal swab specimens and should not be used as a sole basis for treatment. Nasal washings and aspirates are unacceptable for Xpert Xpress SARS-CoV-2/FLU/RSV testing.  Fact Sheet for Patients: EntrepreneurPulse.com.au  Fact Sheet for Healthcare Providers: IncredibleEmployment.be  This test is not yet approved or cleared by the [place] FDA and has been authorized for detection and/or diagnosis of SARS-CoV-2 by FDA under an Emergency Use Authorization (EUA). This EUA will remain in effect (meaning this test can be used) for the duration of the COVID-19 declaration under Section 564(b)(1) of the Act, 21 U.S.C. section 360bbb-3(b)(1), unless the authorization is terminated or revoked.  Performed at [facility], [place] [address], [place], [place] [address]    WBC 04/05/2021 8.1  4.0 - 10.5 K/uL Final   RBC 04/05/2021 4.96  4.22 - 5.81 MIL/uL Final   Hemoglobin 04/05/2021 16.5  13.0 - 17.0 g/dL Final   HCT 04/05/2021 48.4  39.0 - 52.0 % Final   MCV 04/05/2021 97.6  80.0 - 100.0 fL Final   MCH 04/05/2021 33.3  26.0 - 34.0 pg Final   MCHC 04/05/2021 34.1  30.0 - 36.0 g/dL Final   RDW 04/05/2021 12.1  11.5 - 15.5 % Final   Platelets 04/05/2021 226  150 - 400 K/uL Final   nRBC 04/05/2021 0.0  0.0 - 0.2 % Final   Neutrophils Relative % 04/05/2021 68  % Final   Neutro Abs 04/05/2021 5.5  1.7 - 7.7 K/uL Final   Lymphocytes Relative 04/05/2021 20  % Final   Lymphs Abs 04/05/2021 1.7  0.7 - 4.0 K/uL Final   Monocytes Relative 04/05/2021 10  % Final   Monocytes Absolute 04/05/2021 0.8  0.1 - 1.0 K/uL Final   Eosinophils Relative 04/05/2021 1  % Final   Eosinophils Absolute  04/05/2021 0.1  0.0 - 0.5 K/uL Final   Basophils Relative 04/05/2021 1  % Final   Basophils Absolute 04/05/2021 0.1  0.0 - 0.1 K/uL Final   Immature Granulocytes 04/05/2021 0  % Final   Abs Immature Granulocytes 04/05/2021 0.03  0.00 - 0.07 K/uL Final   Performed at [facility], [place] [address], [place], [place] [address]   Sodium 04/05/2021 138  135 - 145 mmol/L Final   Potassium 04/05/2021 4.7  3.5 - 5.1 mmol/L Final   Chloride 04/05/2021 102  98 - 111 mmol/L Final   CO2 04/05/2021 28  22 - 32 mmol/L Final   Glucose, Bld 04/05/2021 92  70 - 99 mg/dL Final   Glucose reference range applies only to samples taken after fasting for at least 8 hours.   BUN 04/05/2021 16  8 - 23 mg/dL Final   Creatinine, Ser 04/05/2021 1.10  0.61 - 1.24 mg/dL Final   Calcium 04/05/2021 9.7  8.9 - 10.3 mg/dL Final   Total Protein 04/05/2021 7.0  6.5 - 8.1 g/dL Final   Albumin 04/05/2021 4.3  3.5 - 5.0 g/dL Final   AST 04/05/2021 30  15 - 41 U/L Final   ALT 04/05/2021 16  0 - 44 U/L Final   Alkaline Phosphatase 04/05/2021 82  38 - 126 U/L Final   Total Bilirubin 04/05/2021 0.6  0.3 - 1.2 mg/dL Final   GFR, Estimated 04/05/2021 >60  >60 mL/min Final   Comment: (NOTE) Calculated using the CKD-EPI Creatinine Equation (2021)    Anion gap  04/05/2021 8  5 - 15 Final   Performed at [facility], [place] [address], [place], [place] [address]   Hgb A1c MFr Bld 04/05/2021 5.7 (A) 4.8 - 5.6 % Final   Comment: (NOTE) Pre diabetes:          5.7%-6.4%  Diabetes:              >6.4%  Glycemic control for   <7.0% adults with diabetes    Mean Plasma Glucose 04/05/2021 116.89  mg/dL Final   Performed at [facility], [place] [address], [place], [place] [address]   Alcohol, Ethyl (B) 04/05/2021 <10  <10 mg/dL Final   Comment: (NOTE) Lowest detectable limit for serum alcohol is 10 mg/dL.  For medical purposes only. Performed at [facility], [place] [address] [place], [place] [address]     Cholesterol 04/05/2021 180  0 - 200 mg/dL Final   Triglycerides 04/05/2021 119  <150 mg/dL Final   HDL 04/05/2021 59  >40 mg/dL Final   Total CHOL/HDL Ratio 04/05/2021 3.1  RATIO Final   VLDL 04/05/2021 24  0 - 40 mg/dL Final   LDL Cholesterol 04/05/2021 97  0 - 99 mg/dL Final   Comment:        Total Cholesterol/HDL:CHD Risk Coronary Heart Disease Risk Table                     Men   Women  1/2 Average Risk   3.4   3.3  Average Risk       5.0   4.4  2 X Average Risk   9.6   7.1  3 X Average Risk  23.4   11.0        Use the calculated Patient Ratio above and the CHD Risk Table to determine the patient's CHD Risk.        ATP III CLASSIFICATION (LDL):  <100     mg/dL   Optimal  100-129  mg/dL   Near or Above                    Optimal  130-159  mg/dL   Borderline  160-189  mg/dL   High  >190     mg/dL   Very High Performed at [place] [address], [place], [place] [address]    Magnesium 04/05/2021 2.1  1.7 - 2.4 mg/dL Final   Performed at [place] [address], [place],  [address]   TSH 04/05/2021 1.374  0.350 - 4.500 uIU/mL Final   Comment: Performed by a 3rd Generation assay with a functional sensitivity of <=0.01 uIU/mL. Performed at [facility], [place] [address], [place], [place] [address]    POC Amphetamine UR 04/05/2021 None Detected  NONE DETECTED (Cut Off Level 1000 ng/mL) Final   POC Secobarbital (BAR) 04/05/2021 None Detected  NONE DETECTED (Cut Off Level 300 ng/mL) Final   POC Buprenorphine (BUP) 04/05/2021 None Detected  NONE DETECTED (Cut Off Level 10 ng/mL) Final   POC Oxazepam (BZO) 04/05/2021 None Detected  NONE DETECTED (Cut Off Level 300 ng/mL) Final   POC Cocaine UR 04/05/2021 None Detected  NONE DETECTED (Cut Off Level 300 ng/mL) Final   POC Methamphetamine UR 04/05/2021 None Detected  NONE DETECTED (Cut Off Level 1000 ng/mL) Final   POC Morphine 04/05/2021 None Detected  NONE DETECTED (Cut Off Level 300 ng/mL) Final    POC Oxycodone UR  04/05/2021 None Detected  NONE DETECTED (Cut Off Level 100 ng/mL) Final   POC Methadone UR 04/05/2021 None Detected  NONE DETECTED (Cut Off Level 300 ng/mL) Final   POC Marijuana UR 04/05/2021 None Detected  NONE DETECTED (Cut Off Level 50 ng/mL) Final   SARS Coronavirus 2 Ag 04/05/2021 Negative  Negative Final    Allergies: Prednisone  PTA Medications: (Not in a hospital admission)   Medical Decision Making  Patient presents back to [facility] from [facility] ED after being sent to the ED from [facility] for head CT (see HPI/Tina Zenia Resides NP 04/05/21 note for details).  Patient is under IVC and recommended for inpatient psychiatric treatment.  Per [place], patient has been accepted to [facility] for inpatient psychiatric treatment and may arrive to [facility] after 0800 on 04/06/21.    Recommendations  Based on my evaluation the patient does not appear to have an emergency medical condition.  Patient will be placed in [facility] continuous assessment for further crisis stabilization and treatment and patient may be transferred to [facility] after 0800 to begin inpatient psychiatric treatment.  We will continue the following medication orders that were initiated yesterday on 04/05/2021 upon initial 04/05/2021 [facility] evaluation prior to patient being transferred to the ED:  -Abilify 5 mg p.o. daily for psychosis  -Klonopin 0.5 mg p.o. QHS for anxiety  -Klonopin 0.5 mg daily as needed for anxiety  -Vistaril 25 mg p.o. 3 times daily as needed for anxiety  04/05/21 labs reviewed:  -PCR Flu A&B, COVID: Negative  -CBC with differential: Within normal limits  -CMP: Within normal limits  -Hemoglobin A1c: Slightly elevated in the prediabetic range at 5.7%  -Ethanol: Less than 10 mg/dL/within normal limits  -Lipid panel: Within normal limits  -Magnesium: Within normal limits  -TSH: Within normal limits  -Prolactin: Results pending  -UDS: Within normal limits  -UA: Results pending  Per chart review, it appears that patient  has had some recent prescriptions filled. However, patient is unable to tell me what home medications he is taking at this time and appears to be confused regarding his home medications.  Thus, recommend that pharmacy attempt to properly reconcile patient's home medications with him today on 04/06/2021 so that any additional home medications that the patient needs may be ordered at that time.  Additionally, of note, patient had EKG done on 04/05/2021 at [facility] prior to the ED transfer to check patient's QTC for antipsychotic medications.  This 04/05/2021 EKG was reviewed by myself, which showed ventricular rate of 96 bpm, PR interval 136 ms, QRS 86 ms, QT/QTC 344/434 ms with the following automatic read of: "normal sinus rhythm Left posterior fascicular block"  Patient denies any chest pain, shortness of breath, or any additional physical symptoms at this time.  Patient's EKG was unable to be scanned into the epic chart at this time.  Thus, due to abnormal automatic read of left posterior vesicular block, patient's EKG was faxed to [facility] for EDP review and patient's EKG was reviewed with Joshua. Roxanne Mins via phone, in which Joshua. Roxanne Mins confirmed that patient's EKG did not show any acute/concerning findings.  However, Joshua. Roxanne Mins did state that this EKG was not done properly as the lesions for the left and right arm were switched/not properly placed.  Thus, new order for new EKG placed to be done on 04/06/2021 in order to have an accurate EKG on file.  Based on current EKG, patient's QT/QTC appears to be appropriate for continuation of antipsychotic medications at this  time.  Prescilla Sours, PA-C 04/06/21  1:31 AM

## 2021-04-06 NOTE — Progress Notes (Addendum)
Psychoeducational Group Note  Date:  04/06/2021 Time:  2000  Group Topic/Focus:  Evening AA Meeting  Participation Level: Did Not Attend  Participation Quality:  Not Applicable  Affect:  Not Applicable  Cognitive:  Not Applicable  Insight:  Not Applicable  Engagement in Group: Not Applicable  Additional Comments:  Did not attend.   Shellia Cleverly 04/06/2021, 9:39 PM

## 2021-04-06 NOTE — ED Notes (Addendum)
Pt refused EKG.

## 2021-04-06 NOTE — Tx Team (Signed)
Initial Treatment Plan 04/06/2021 4:44 PM Joshua Lloyd TYY:[id number]    PATIENT STRESSORS: Financial difficulties   Occupational concerns     PATIENT STRENGTHS: Capable of independent living  Engineer, drilling fund of knowledge  Physical Health  Supportive family/friends    PATIENT IDENTIFIED PROBLEMS: Anxiety  stress                   DISCHARGE CRITERIA:  Ability to meet basic life and health needs Improved stabilization in mood, thinking, and/or behavior Motivation to continue treatment in a less acute level of care  PRELIMINARY DISCHARGE PLAN: Outpatient therapy Return to previous living arrangement  PATIENT/FAMILY INVOLVEMENT: This treatment plan has been presented to and reviewed with the patient, Joshua Lloyd.The patient has been given the opportunity to ask questions and make suggestions.  Roxanna Mew, RN 04/06/2021, 4:44 PM

## 2021-04-06 NOTE — BHH Suicide Risk Assessment (Addendum)
[facility] Admission Suicide Risk Assessment   Nursing information obtained from:    Demographic factors:  Male, caucasian Current Mental Status: paranoia Loss Factors:  job stressors Historical Factors:  job stressors, recent psychiatric outpatient diagnoses/treatments Risk Reduction Factors:  Positive social support, Positive therapeutic relationship, Living with another person, especially a relative  Total Time Spent in Direct Patient Care:  I personally spent 60 minutes on the unit in direct patient care. The direct patient care time included face-to-face time with the patient, reviewing the patient's chart, communicating with other professionals, and coordinating care. Greater than 50% of this time was spent in counseling or coordinating care with the patient regarding goals of hospitalization, psycho-education, and discharge planning needs.  Principal Problem: MDD (major depressive disorder), single episode, severe with psychosis ([place]) Diagnosis:  Principal Problem:   MDD (major depressive disorder), single episode, severe with psychosis ([place]) Active Problems:   Anxiety disorder, unspecified  Subjective Data: The patient is a 60y/o male who presented initially as a walk-in to [facility] on 10/13 accompanied by his wife for evaluation of sleeplessness, anxiety, and paranoia. The patient was placed under IVC and was transferred to [facility] for continued management.   On exam, the patient is a poor historian and is reluctant to engage in an interview making history difficult to obtain. 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 He states he did not feel he was performing up to his usual work standards and started feeling extreme stress in his job. He states he saw his PCP who initially put him on "low dose" Zoloft for 2-3 weeks  and then referred him to see a psychiatrist (Dr. Casimiro Needle). He thinks that Dr. Casimiro Needle changed him off Zoloft and started him on Remeron and low dose Klonopin to help with his sleep and anxiety. He admits that in the last 3-4 weeks he has been paranoid at home and has thoughts that he is under surveillance by unknown persons related to his job performance. In review of his records, he reportedly believed he was being monitored by "high tech technology" and had belief that he was being followed, that the feds were coming after him, and was agitated at home. He reportedly was sleeping with his clothes on and wallet and keys in his pockets secondary to paranoia. He denies AVH, first rank symptoms, or magical thinking but admits that he "gets messages from the Bible" when he reads scriptures which are personal to him. He denies receiving other messages from TV or electrical appliances and becomes defensive and avoidant when questioned about any hyper-religious thinking. He admits that even on the inpatient unit he still perceives that he "is being monitored" but will not give details.  According to his records, he was prescribed Abilify by Dr. Casimiro Needle for signs of paranoia but only took one dose before stopping the medication. He reportedly received Abilify Maintena 47m IM on 04/04/21 by Dr. PCasimiro Needlebut states he has not continued any oral bridging antipsychotic since receiving the LAI. He admits that for the last 3-4 weeks he has been misplacing things, forgetful, feeling foggy and confused, and is worried he will lose things. His wife has not been letting him drive he states because he was getting lost and having trouble with GPS. He states that he has been feeling sad and  down for the last 3-4 weeks with associated low appetite (15 lb weight loss), insomnia, anhedonia, guilt about job performance, low energy, poor focus, and hopeless thinking. He denies SI or HI and denies previous suicide attempts. He denies h/o  TBI or seizures and denies any significant PMH. He denies h/o mania or hypomania. He denies known psychiatric illness in the family and denies any alcohol or illicit drug use.  On exam he is paranoid and guarded, frequently looks around the room as if being watched, and requests to read my ID badge, refuses to engage in meaningful conversation about medications, and reports he feels staff are "torturing him" with questions and lab draws. He is irritable, defensive, and argumentative on exam. Extensive time was spent discussing treatment options, r/b/se/a to medication options, and recommendations for additional lab studies. See H&P for additional details.  Continued Clinical Symptoms:  Alcohol Use Disorder Identification Test Final Score (AUDIT): 0 The "Alcohol Use Disorders Identification Test", Guidelines for Use in Primary Care, Second Edition.  World Pharmacologist [facility]). Score between 0-7:  no or low risk or alcohol related problems. Score between 8-15:  moderate risk of alcohol related problems. Score between 16-19:  high risk of alcohol related problems. Score 20 or above:  warrants further diagnostic evaluation for alcohol dependence and treatment.  CLINICAL FACTORS:   Depression:   Anhedonia Delusional Impulsivity Insomnia Severe Currently Psychotic Previous Psychiatric Diagnoses and Treatments  Musculoskeletal: Strength & Muscle Tone: uncooperative for testing Gait & Station: normal Patient leans: N/A Psychiatric Specialty Exam: Physical Exam Vitals reviewed.  HENT:     Head: Normocephalic.  Pulmonary:     Effort: Pulmonary effort is normal.  Neurological:     General: No focal deficit present.     Mental Status: He is alert.    Review of Systems- would not cooperate for questioning  Blood pressure 96/81, pulse (!) 103, temperature 97.6 F (36.4 C), temperature source Oral, resp. rate 16, height '6\' 1"'  (1.854 m), weight 78.5 kg, SpO2 98 %.Body mass index is 22.82  kg/m.  General Appearance:   casually dressed, fair hygiene, appears stated age  Eye Contact:  Fair - frequently looks away or around room  Speech:  Clear and Coherent and Normal Rate  Volume:  Normal  Mood:  Anxious, Dysphoric, and Irritable  Affect:  Constricted and irritable, guarded  Thought Process:  tangential and circumstantial; ruminative about discharge planning  Orientation:  would not cooperate for questioning  Thought Content:   Has paranoid and persecutory delusions that he is being monitored at home and on the unit; appears guarded and paranoid on exam; denies AVH; makes vague reference to receiving personalized messages from scripture; denies first rank symptoms  Suicidal Thoughts:  No  Homicidal Thoughts:  No  Memory:  vague descriptions of recent events - would not cooperate for full testing  Judgement:  Impaired  Insight:  Lacking  Psychomotor Activity:   animated when he speaks; fidgety  Concentration:  Concentration: Fair and Attention Span: Fair - requiring frequent redirection  Recall:  would not cooperate for full testing  Fund of Knowledge:  Fair  Language:  Good  Akathisia:  Negative  Assets:  Communication Skills Desire for Improvement Housing Resilience Social Support Vocational/Educational  ADL's:  independent  Cognition:  Impaired,  Mild  Sleep:  Number of Hours: 5.5   COGNITIVE FEATURES THAT CONTRIBUTE TO RISK:  Closed-mindedness and Thought constriction (tunnel vision)    SUICIDE RISK:   Moderate:  In the  context of paranoid and persecutory delusions and present level of agitation, irritability.   PLAN OF CARE: Patient admitted involuntarily to [facility] and 2nd opinion completed. Admission labs reviewed: respiratory panel negative, SARS negative, CBC WNL, CMP WNL, A1c 5.7, ETOH <10, Lipid panel WNL, Mag+ 2.1, TSH 1.374; Prolactin 21.5; UDS negative; UA pending; EKG shows NSR 96bpm with left posterior fascicular block no significant change since last  tracing QTC 459m. MRI of brain with and without contrast 03/02/21 showed normal brain MRI for patient age with no acute abnormality. Head CT w/o contrast 04/05/21 showed no acute intracranial abnormalities.  Discussed with patient that I would recommend additional testing for organic w/u of confusion and paranoia including HIV, RPR, ESR, ceruloplasmin, ANA, B12, and heavy metal and that I would order these tests. He agrees to consider labs. I recommended that he reconsider an antidepressant option and after discussion of medications, he agrees to trial of Effexor XR 37.544mwith discontinuation of Remeron. The r/b/se/a to Effexor were discussed and he consents to medication trial. He was encouraged to restart oral Abilify as a bridging dose while his Abilify LAI reaches steady state. The r/b/se/a to Abilify were discussed and will order 59m81mo and see if patient will agree to take the medication. He is presently reluctant to take any oral antipsychotic and his Abilify MaiJodi Geraldsll take several weeks to become therapeutic. I advised that he may require forced medication over objection if he does not consent to oral antipsychotic use given his present level of paranoia, delusions, and irritability. Will have PRN agitation protocol ordered. Will attempt to talk with his wife for collateral with his consent. [facility] will comply with testing.  Differential Diagnosis:  MDD single episode severe with psychotic features (r/o pseudodementia, r/o major neurocognitive d/o with behavioral disturbance, r/o brief psychotic d/o)  I certify that inpatient services furnished can reasonably be expected to improve the patient's condition.   AmyHarlow AsaD, FAPA 04/07/2021, 11:50 AM

## 2021-04-06 NOTE — ED Notes (Signed)
Pt returned from [facility] ED for CT scan of head.  CT scan cleared/WNL as per staff.  Pt remains awake, alert & responsive, very appreciative of care.  Skin search completed, pt resting at present.  Monitoring for safety.

## 2021-04-06 NOTE — Progress Notes (Signed)
   04/06/21 2143  Psych Admission Type (Psych Patients Only)  Admission Status Voluntary  Psychosocial Assessment  Patient Complaints Anxiety;Suspiciousness;Worrying  Eye Contact Fair  Facial Expression Worried;Anxious  Affect Preoccupied  Speech Logical/coherent  Interaction Assertive  Motor Activity Slow  Appearance/Hygiene Unremarkable  Behavior Characteristics Cooperative;Anxious  Mood Preoccupied  Thought Process  Coherency Concrete thinking  Content Paranoia;Preoccupation  Delusions Paranoid  Perception Derealization  Hallucination None reported or observed  Judgment Impaired  Confusion Mild  Danger to Self  Current suicidal ideation? Denies  Danger to Others  Danger to Others None reported or observed

## 2021-04-06 NOTE — Progress Notes (Signed)
Report called to [facility] and given to [place], RN. [employer] called for transport.

## 2021-04-06 NOTE — ED Notes (Signed)
Pt ambulatory, alert, and oriented X4 on and off the unit. Education, support, and encouragement provided. Pt was transferred to [facility] via [place] [employer]. Pt's belongings in locker returned. Pt is calm and cooperative on and off unit.

## 2021-04-07 DIAGNOSIS — F419 Anxiety disorder, unspecified: Secondary | ICD-10-CM

## 2021-04-07 DIAGNOSIS — F323 Major depressive disorder, single episode, severe with psychotic features: Secondary | ICD-10-CM | POA: Diagnosis present

## 2021-04-07 LAB — PROLACTIN: Prolactin: 21.5 ng/mL — ABNORMAL HIGH (ref 4.0–15.2)

## 2021-04-07 MED ORDER — TRAZODONE HCL 50 MG PO TABS
50.0000 mg | ORAL_TABLET | Freq: Every day | ORAL | Status: DC
  Administered 2021-04-07 – 2021-04-09 (×3): 50 mg via ORAL
  Filled 2021-04-07 (×5): qty 1

## 2021-04-07 MED ORDER — VENLAFAXINE HCL ER 37.5 MG PO CP24
37.5000 mg | ORAL_CAPSULE | Freq: Every day | ORAL | Status: DC
Start: 2021-04-07 — End: 2021-04-09
  Administered 2021-04-07 – 2021-04-09 (×3): 37.5 mg via ORAL
  Filled 2021-04-07 (×6): qty 1

## 2021-04-07 NOTE — BHH Group Notes (Signed)
Pt did not attend Psych-Educational Group.

## 2021-04-07 NOTE — Group Note (Signed)
Group Topic: Goal Setting  Group Date: 04/07/2021 Start Time: 0900 End Time: 1000 Facilitators: Paulino Rily, RN  Department: [facility] INPATIENT ADULT 300B  Number of Participants: 13  Group Focus: affirmation, clarity of thought, and goals/reality orientation Treatment Modality:  Psychoeducation Interventions utilized were assignment, group exercise, and support Purpose: To be able to understand and verbalize the reason for their admission to the hospital. To understand that the medication helps with their chemical imbalance but they also need to work on their choices in life. To be challenged to develop a list of 30 positives about themselves. Also introduce the concept that "feelings" are not reality.  Name: Joshua Lloyd Date of Birth: Jul 21, 1960  MR: [id number]    Level of Participation: when cued Quality of Participation: attentive Interactions with others: gave feedback Mood/Affect: anxious Triggers (if applicable):  Cognition: pressured speech and processing slowly Progress: Minimal Response: Pt attended this group but appeared paranoid of what was being said Plan: patient will be encouraged to attend all the groups  Patients Problems:  Patient Active Problem List   Diagnosis Date Noted   MDD (major depressive disorder), single episode, severe with psychosis ([place]) 04/07/2021   Anxiety disorder, unspecified 04/07/2021   Psychosis ([place])    Prostate cancer ([place]) 10/17/2016

## 2021-04-07 NOTE — Progress Notes (Signed)
[place] Group Notes:  (Nursing/MHT/Case Management/Adjunct)  Date:  04/07/2021  Time:  2000  Type of Therapy:   wrap up group  Participation Level:  Active  Participation Quality:  Appropriate, Attentive, Sharing, and Supportive  Affect:  Anxious and Flat  Cognitive:  Lacking  Insight:  Lacking  Engagement in Group:   Attentive  Modes of Intervention:  Clarification, Education, and Support  Summary of Progress/Problems: Positive thinking and positive change were discussed.   Shellia Cleverly 04/07/2021, 8:45 PM

## 2021-04-07 NOTE — BHH Counselor (Signed)
Adult Comprehensive Assessment  Patient ID: Joshua Lloyd, male   DOB: 05/28/1961, 60 y.o.   MRN: [id number]  Information Source: Information source: Patient (CSW also obtained collateral from pt's wife, [name] and adult sons, as pt is paranoid and seems confused.)  Current Stressors:  Patient states their primary concerns and needs for treatment are:: "I don't know. I need to get off all these medicines and just be able to go home and right some wrongs; make some apologies. Then everything will be okay." Per wife/sons, Pt is not willing to accept forgiveness and has been increasingly paraonid, slow to process, and has not been eating unless food is given to him (decompensation began about 12 weeks ago, with symptoms acutely woresening about 3 weeks ago). Patient states their goals for this hospitilization and ongoing recovery are:: "to go home." per family, they want him stabilized with medication, with a decrease/elimination of paranoia/delusional thinking, and a diagnosis explaining the sudden onset of these symtpoms if possible. They also question if there is a medical/organic cause for his sudden onset of paranoia, processing issues. 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 Pt and family identify work changes as a major stressor. These changes took place in Aug 2022 and pt has not been able to function appropriately at [facility] overwhelmed, not able to perform basic tasks such as completing an email, etc. Family Relationships: very loving family-wife and adults sons, mother Museum/gallery curator / Lack of resources (include bankruptcy): employed; Multimedia programmer. no issues with financies Housing / Lack of housing: lives with his wife Physical health (include injuries & life threatening diseases): healthy Social relationships: great family support/friends Substance abuse: none Bereavement / Loss:  none  Living/Environment/Situation:  Living Arrangements: Spouse/significant other Living conditions (as described by patient or guardian): loving comfortable home Who else lives in the home?: wife How long has patient lived in current situation?: many years What is atmosphere in current home: Comfortable, Quarry manager, Supportive  Family History:  Marital status: Married Number of Years Married:  (he could not recall. over 35 years) What types of issues is patient dealing with in the relationship?: none. wife is loving and supportive and worried about pt's mental health Additional relationship information: wife would like a call from social work on Monday with updates on his progress if possible. Are you sexually active?: Yes What is your sexual orientation?: heterosexual Has your sexual activity been affected by drugs, alcohol, medication, or emotional stress?: n/a Does patient have children?: Yes How many children?: 2 How is patient's relationship with their children?: two sons and grandchildren. Sons are very supportive and provided some collateral information  Childhood History:  By whom was/is the patient raised?: Both parents Additional childhood history information: Very close to his parents Description of patient's relationship with caregiver when they were a child: close Patient's description of current relationship with people who raised him/her: father-deceased; mother-very close with her. How were you disciplined when you got in trouble as a child/adolescent?: n/a Does patient have siblings?: No Did patient suffer any verbal/emotional/physical/sexual abuse as a child?: No Did patient suffer from severe childhood neglect?: No Has patient ever been sexually abused/assaulted/raped as an adolescent or adult?: No Was the patient ever a victim of a crime or a disaster?: No Witnessed domestic violence?: No Has patient been affected by domestic violence as an adult?: No  Education:   Highest grade of school patient has completed: completed high school Currently a student?:  No Learning disability?: No  Employment/Work Situation:   Employment Situation: Employed 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 wife notes that pt has experienced alot of stress lately and has lost sleep;) Do You Work More Than One Job?: No Work Stressors: increased responsibilities; unable to provide more information than this Patient's Job has Been Impacted by Current Illness: No Describe how Patient's Job has Been Impacted: n/a What is the Longest Time Patient has Held a Job?: see above Where was the Patient Employed at that Time?: see above Has Patient ever Been in the [employer]?: No  Financial Resources:   Financial resources: Income from employment, Private insurance Does patient have a representative payee or guardian?: No  Alcohol/Substance Abuse:   What has been your use of drugs/alcohol within the last 12 months?: n/a If attempted suicide, did drugs/alcohol play a role in this?: No Alcohol/Substance Abuse Treatment Hx: Denies past history If yes, describe treatment: n/a Has alcohol/substance abuse ever caused legal problems?: No  Social Support System:   Pensions consultant Support System: Good Describe Community Support System: loving family; friends; colleagues Type of faith/religion: not explored How does patient's faith help to cope with current illness?: n/a  Leisure/Recreation:   Do You Have Hobbies?: No (not explored with pt due to paranoia)  Strengths/Needs:   What is the patient's perception of their strengths?: intelligent; loving family; financially secure Patient states they can use these personal strengths during their treatment to contribute to their recovery: pt has a strong support system with  family Patient states these barriers may affect/interfere with their treatment: Pt's wife fears that pt will not take his medication or go to appts--we discussed making it clear to Patient states these barriers may affect their return to the community: none. per family-noncompliance with medication is a major concern Other important information patient would like considered in planning for their treatment: n/a  Discharge Plan:   Currently receiving community mental health services: Yes (From Whom) ([facility]-Dr. Casimiro Needle MD) Patient states concerns and preferences for aftercare planning are: resume services with Dr. Casimiro Needle Patient states they will know when they are safe and ready for discharge when: Pt feels he is fine and ready to go home. Pt's family is very concerned due to paranoia/possible delusions Does patient have access to transportation?: Yes (family) Does patient have financial barriers related to discharge medications?: No Patient description of barriers related to discharge medications: none Will patient be returning to same living situation after discharge?: Yes  Summary/Recommendations:   Summary and Recommendations (to be completed by the evaluator): Pt is a 60yo male living in [place], [place] ([place] with his wife. Pt has 2 adult sons and grandchildren with whom he is very close. Pt completed high school. He is employed as an Medical illustrator and experienced a major work stressor about 3 months ago when some job responsibilities were adjusted. Pt has been experiencing increased confusion, paranoia, slower processing speed, is easily overwhelmed and agitated, and has lost his appetite (will only eat when provided with a food). Pt has a history of prostate cancer in 2018 per chart review. Pt has lost 13lbs since Aug 22. Over the past 3 weeks, pt's paranoia and other symptoms have significantly worsened. Pt's family is extremely worried and confused, especially due to  the acute onset of symtpoms and a lifetime before of no  psychiatric issues. CSW assessing.  Avelina Laine MSW, LCSW 04/07/2021 3:58 PM

## 2021-04-07 NOTE — Progress Notes (Signed)
   04/07/21 2226  Psych Admission Type (Psych Patients Only)  Admission Status Voluntary  Psychosocial Assessment  Patient Complaints Anxiety;Decreased concentration  Eye Contact Fair  Facial Expression Anxious;Trembling lip  Affect Preoccupied;Apprehensive  Speech Logical/coherent  Interaction Cautious  Motor Activity Other (Comment) (WDL)  Appearance/Hygiene Unremarkable  Behavior Characteristics Fidgety;Anxious  Mood Anxious;Apprehensive;Preoccupied  Thought Pension scheme manager thinking  Content Paranoia;Preoccupation  Delusions Paranoid  Perception Derealization  Hallucination None reported or observed  Judgment Impaired  Confusion Mild  Danger to Self  Current suicidal ideation? Denies  Danger to Others  Danger to Others None reported or observed

## 2021-04-07 NOTE — BHH Group Notes (Signed)
Adult Psychoeducational Group Note  Date:  04/07/2021 Time:  9:28 AM  Group Topic/Focus:  Goals Group:   The focus of this group is to help patients establish daily goals to achieve during treatment and discuss how the patient can incorporate goal setting into their daily lives to aide in recovery.  Participation Level:  Active Pt attended goals group this morning.   Joshua Lloyd 04/07/2021, 9:28 AM

## 2021-04-07 NOTE — Progress Notes (Signed)
Patient's wife, Andyn Sales, called to inquire about pt's status. Pt's wife stated that she had received a message from [facility] regarding pt's  discharge, and was very concerned due to pt's instability. Wife was reassured that pt had not been discharged. Wife stated that pt's delusional, paranoid behavior began after changing jobs. Wife reported that his new job has given him acute anxiety and poor sleep, and exhaustion. If possible, wife would like to speak with Provider.  She can be reached at [phone]

## 2021-04-07 NOTE — H&P (Signed)
Psychiatric Admission Assessment Adult  Patient Identification: Joshua Lloyd MRN:  [id number] Date of Evaluation:  04/07/2021 Chief Complaint:  Psychosis, unspecified psychosis type ([place]) [F29] Principal Diagnosis: MDD (major depressive disorder), single episode, severe with psychosis ([place]) Diagnosis:  Principal Problem:   MDD (major depressive disorder), single episode, severe with psychosis ([place]) Active Problems:   Anxiety disorder, unspecified  History of Present Illness: Patient was seen and evaluated with Dr Nelda Marseille, chart reviewed and case discussed with the treatment team. Patient is a 60 year old male who presented , voluntarily to [facility] on 10/13 for a walk-in assessment. He was  accompanied by his wife, Evie. He was placed under IVC by the provider at [facility]. He was having difficulty at work, which started about 12 weeks ago, he had been under an immense amount of stress. He saw his PCP and was started on Zoloft but apparently believed "men were looking into his house and the Zoloft was stopped. He then saw [name], psychiatrist, and was started on Abilify (8 days prior to [facility]), Remeron (2 weeks prior to [facility])  and low dose Klonopin for sleep (6 weeks prior to [facility]). He only took one dose of Abilify and said it was too strong.  He has been compliant with the Klonopin, however there were no benzos in his UDS, possibly he had cleared them prior to his UDS. Due to his refusal to take the oral Abilify, [name] gave him an injection of Abilify Maintena 400 mg LAI on 10/12.  Today, patient presents with disorganized, tangential thought process with some thought blocking. He stated he was having difficulty with the technology at work and was unable to preform his job, he works in [employer]. He stated he believes he is being "monitored" but can not say by whom, and feels like his mind is "cloudy and confused." He stated he has not been sleeping very well but the Klonopin he  takes has helped. He stated he reads the Bible but does not feel like the messages he reads are only for him, he then stated "look, we don't need to get into all of that." He stated he went to his PCP for anxiety and was taking Zoloft but then was taken off that and went to see [name] who put him on Abilify. He is very against taking medication and becomes irritable, angry and labile when discussing the need for medication and further labs to rule out an organic cause for his paranoia. He is suspicious that "we" (providers) are trying to "crush" him and make him do what we want. He is unreasonable and argumentative. He appears confused, agitated and irritable. He described feeling overwhelmed, anxious, loss of interest in usual pleasures, difficulty sleeping and navigating technology, weight loss, difficulty concentrating and feeling overwhelmed and confused. He stated "I feel like my body is breaking down", in reference to his mental state and paranoia.  He denies any physical complaints or medical diagnoses. He did stated he feels tense in his shoulders and head and has a headache.  He denies a family history of mental health problems or suicide attempts. He denies alcohol and drug use or family history of addiction. He denies suicidal and homicidal ideation and denies auditory and visual hallucinations, however he is suspicious and frequently looks over his shoulder. He also asked Dr Nelda Marseille to see her ID badge and studied it for a minute and said "interesting." He denies any previous history of mental health problems. He  denies ever being prescribed any mental health medications prior to being on Zoloft. He is reluctant to take medication in the hospital and was educated regarding forced medication orders if he refuses.   Collateral: attempted to contact patient's wife, Olegario Shearer [phone], no answer, left HIPAA compliant VM, awaiting call back.   Associated Signs/Symptoms: Depression Symptoms:   depressed mood, anhedonia, insomnia, psychomotor agitation, feelings of worthlessness/guilt, difficulty concentrating, impaired memory, anxiety, loss of energy/fatigue, disturbed sleep, weight loss, Duration of Depression Symptoms: Greater than two weeks  (Hypo) Manic Symptoms:  Irritable Mood, Labiality of Mood, Anxiety Symptoms:  Excessive Worry, Psychotic Symptoms:  Hallucinations: None Paranoia, PTSD Symptoms: Negative Total Time spent with patient: 1 hour  Past Psychiatric History: Anxiety  Is the patient at risk to self? Yes.  Denies suicidal ideation but has extreme paranoia  Has the patient been a risk to self in the past 6 months? No.  Has the patient been a risk to self within the distant past? No.  Is the patient a risk to others? No.  Has the patient been a risk to others in the past 6 months? No.  Has the patient been a risk to others within the distant past? No.   Prior Inpatient Therapy:   Prior Outpatient Therapy:    Alcohol Screening: 1. How often do you have a drink containing alcohol?: Never 2. How many drinks containing alcohol do you have on a typical day when you are drinking?: 1 or 2 3. How often do you have six or more drinks on one occasion?: Never AUDIT-C Score: 0 4. How often during the last year have you found that you were not able to stop drinking once you had started?: Never 5. How often during the last year have you failed to do what was normally expected from you because of drinking?: Never 6. How often during the last year have you needed a first drink in the morning to get yourself going after a heavy drinking session?: Never 7. How often during the last year have you had a feeling of guilt of remorse after drinking?: Never 8. How often during the last year have you been unable to remember what happened the night before because you had been drinking?: Never 9. Have you or someone else been injured as a result of your drinking?: No 10. Has  a relative or friend or a doctor or another health worker been concerned about your drinking or suggested you cut down?: No Alcohol Use Disorder Identification Test Final Score (AUDIT): 0 Substance Abuse History in the last 12 months:  No. Consequences of Substance Abuse: Negative Previous Psychotropic Medications: Yes  Psychological Evaluations: No  Past Medical History:  Past Medical History:  Diagnosis Date   Cancer ([place])    prostate    Past Surgical History:  Procedure Laterality Date   NO PAST SURGERIES     ROBOT ASSISTED LAPAROSCOPIC RADICAL PROSTATECTOMY N/A 10/17/2016   Procedure: ROBOTIC ASSISTED LAPAROSCOPIC RADICAL PROSTATECTOMY LEVEL 2/ BILATERAL PELVIC LYMPHADENECTOMY;  Surgeon: Raynelle Bring, MD;  Location: [facility];  Service: Urology;  Laterality: N/A;   WRIST SURGERY     skiing accident when 60 years old   Family History: History reviewed. No pertinent family history. Family Psychiatric  History: Patient denies Tobacco Screening:   Social History:  Social History   Substance and Sexual Activity  Alcohol Use Yes   Alcohol/week: 1.0 standard drink   Types: 1 Glasses of wine per week   Comment: ocassionally  Social History   Substance and Sexual Activity  Drug Use No    Additional Social History:                           Allergies:   Allergies  Allergen Reactions   Prednisone Rash   Lab Results:  Results for orders placed or performed during the hospital encounter of 04/05/21 (from the past 48 hour(s))  Resp Panel by RT-PCR (Flu A&B, Covid) Nasopharyngeal Swab     Status: None   Collection Time: 04/05/21  5:21 PM   Specimen: Nasopharyngeal Swab; Nasopharyngeal(NP) swabs in vial transport medium  Result Value Ref Range   SARS Coronavirus 2 by RT PCR NEGATIVE NEGATIVE    Comment: (NOTE) SARS-CoV-2 target nucleic acids are NOT DETECTED.  The SARS-CoV-2 RNA is generally detectable in upper respiratory specimens during the acute phase of  infection. The lowest concentration of SARS-CoV-2 viral copies this assay can detect is 138 copies/mL. A negative result does not preclude SARS-Cov-2 infection and should not be used as the sole basis for treatment or other patient management decisions. A negative result may occur with  improper specimen collection/handling, submission of specimen other than nasopharyngeal swab, presence of viral mutation(s) within the areas targeted by this assay, and inadequate number of viral copies(<138 copies/mL). A negative result must be combined with clinical observations, patient history, and epidemiological information. The expected result is Negative.  Fact Sheet for Patients:  EntrepreneurPulse.com.au  Fact Sheet for Healthcare Providers:  IncredibleEmployment.be  This test is no t yet approved or cleared by the [place] FDA and  has been authorized for detection and/or diagnosis of SARS-CoV-2 by FDA under an Emergency Use Authorization (EUA). This EUA will remain  in effect (meaning this test can be used) for the duration of the COVID-19 declaration under Section 564(b)(1) of the Act, 21 U.S.C.section 360bbb-3(b)(1), unless the authorization is terminated  or revoked sooner.       Influenza A by PCR NEGATIVE NEGATIVE   Influenza B by PCR NEGATIVE NEGATIVE    Comment: (NOTE) The Xpert Xpress SARS-CoV-2/FLU/RSV plus assay is intended as an aid in the diagnosis of influenza from Nasopharyngeal swab specimens and should not be used as a sole basis for treatment. Nasal washings and aspirates are unacceptable for Xpert Xpress SARS-CoV-2/FLU/RSV testing.  Fact Sheet for Patients: EntrepreneurPulse.com.au  Fact Sheet for Healthcare Providers: IncredibleEmployment.be  This test is not yet approved or cleared by the [place] FDA and has been authorized for detection and/or diagnosis of SARS-CoV-2 by FDA  under an Emergency Use Authorization (EUA). This EUA will remain in effect (meaning this test can be used) for the duration of the COVID-19 declaration under Section 564(b)(1) of the Act, 21 U.S.C. section 360bbb-3(b)(1), unless the authorization is terminated or revoked.  Performed at [facility], [place] [address], [place], [place] [address]   POC SARS Coronavirus 2 Ag-ED - Nasal Swab     Status: Normal   Collection Time: 04/05/21  5:21 PM  Result Value Ref Range   SARS Coronavirus 2 Ag Negative Negative  CBC with Differential/Platelet     Status: None   Collection Time: 04/05/21  5:24 PM  Result Value Ref Range   WBC 8.1 4.0 - 10.5 K/uL   RBC 4.96 4.22 - 5.81 MIL/uL   Hemoglobin 16.5 13.0 - 17.0 g/dL   HCT 48.4 39.0 - 52.0 %   MCV 97.6 80.0 - 100.0 fL  MCH 33.3 26.0 - 34.0 pg   MCHC 34.1 30.0 - 36.0 g/dL   RDW 12.1 11.5 - 15.5 %   Platelets 226 150 - 400 K/uL   nRBC 0.0 0.0 - 0.2 %   Neutrophils Relative % 68 %   Neutro Abs 5.5 1.7 - 7.7 K/uL   Lymphocytes Relative 20 %   Lymphs Abs 1.7 0.7 - 4.0 K/uL   Monocytes Relative 10 %   Monocytes Absolute 0.8 0.1 - 1.0 K/uL   Eosinophils Relative 1 %   Eosinophils Absolute 0.1 0.0 - 0.5 K/uL   Basophils Relative 1 %   Basophils Absolute 0.1 0.0 - 0.1 K/uL   Immature Granulocytes 0 %   Abs Immature Granulocytes 0.03 0.00 - 0.07 K/uL    Comment: Performed at [facility] [address] [place], [place] [address]  Comprehensive metabolic panel     Status: None   Collection Time: 04/05/21  5:24 PM  Result Value Ref Range   Sodium 138 135 - 145 mmol/L   Potassium 4.7 3.5 - 5.1 mmol/L   Chloride 102 98 - 111 mmol/L   CO2 28 22 - 32 mmol/L   Glucose, Bld 92 70 - 99 mg/dL    Comment: Glucose reference range applies only to samples taken after fasting for at least 8 hours.   BUN 16 8 - 23 mg/dL   Creatinine, Ser 1.10 0.61 - 1.24 mg/dL   Calcium 9.7 8.9 - 10.3 mg/dL   Total Protein 7.0 6.5 - 8.1 g/dL   Albumin 4.3  3.5 - 5.0 g/dL   AST 30 15 - 41 U/L   ALT 16 0 - 44 U/L   Alkaline Phosphatase 82 38 - 126 U/L   Total Bilirubin 0.6 0.3 - 1.2 mg/dL   GFR, Estimated >60 >60 mL/min    Comment: (NOTE) Calculated using the CKD-EPI Creatinine Equation (2021)    Anion gap 8 5 - 15    Comment: Performed at [facility] [address], [place], [place] [address]  Hemoglobin A1c     Status: Abnormal   Collection Time: 04/05/21  5:24 PM  Result Value Ref Range   Hgb A1c MFr Bld 5.7 (H) 4.8 - 5.6 %    Comment: (NOTE) Pre diabetes:          5.7%-6.4%  Diabetes:              >6.4%  Glycemic control for   <7.0% adults with diabetes    Mean Plasma Glucose 116.89 mg/dL    Comment: Performed at [facility] [address], [place], [place] [address]  Ethanol     Status: None   Collection Time: 04/05/21  5:24 PM  Result Value Ref Range   Alcohol, Ethyl (B) <10 <10 mg/dL    Comment: (NOTE) Lowest detectable limit for serum alcohol is 10 mg/dL.  For medical purposes only. Performed at [facility], [place] [address], [place], [place] [address]   Lipid panel     Status: None   Collection Time: 04/05/21  5:24 PM  Result Value Ref Range   Cholesterol 180 0 - 200 mg/dL   Triglycerides 119 <150 mg/dL   HDL 59 >40 mg/dL   Total CHOL/HDL Ratio 3.1 RATIO   VLDL 24 0 - 40 mg/dL   LDL Cholesterol 97 0 - 99 mg/dL    Comment:        Total Cholesterol/HDL:CHD Risk Coronary Heart Disease Risk  Table                     Men   Women  1/2 Average Risk   3.4   3.3  Average Risk       5.0   4.4  2 X Average Risk   9.6   7.1  3 X Average Risk  23.4   11.0        Use the calculated Patient Ratio above and the CHD Risk Table to determine the patient's CHD Risk.        ATP III CLASSIFICATION (LDL):  <100     mg/dL   Optimal  100-129  mg/dL   Near or Above                    Optimal  130-159  mg/dL   Borderline  160-189  mg/dL   High  >190     mg/dL   Very High Performed at [facility] [address], [place], [place] [address]   Magnesium     Status: None   Collection Time: 04/05/21  5:24 PM  Result Value Ref Range   Magnesium 2.1 1.7 - 2.4 mg/dL    Comment: Performed at [facility], [place] [address] [place], [place] [address]  TSH     Status: None   Collection Time: 04/05/21  5:24 PM  Result Value Ref Range   TSH 1.374 0.350 - 4.500 uIU/mL    Comment: Performed by a 3rd Generation assay with a functional sensitivity of <=0.01 uIU/mL. Performed at [facility], [place] [address], [place], [place] [address]   Prolactin     Status: Abnormal   Collection Time: 04/05/21  5:24 PM  Result Value Ref Range   Prolactin 21.5 (H) 4.0 - 15.2 ng/mL    Comment: (NOTE) Performed At: [facility] [place], [place] [id number] Rush Farmer MD [id number]   POCT Urine Drug Screen - ([facility])     Status: Normal   Collection Time: 04/05/21  5:26 PM  Result Value Ref Range   POC Amphetamine UR None Detected NONE DETECTED (Cut Off Level 1000 ng/mL)   POC Secobarbital (BAR) None Detected NONE DETECTED (Cut Off Level 300 ng/mL)   POC Buprenorphine (BUP) None Detected NONE DETECTED (Cut Off Level 10 ng/mL)   POC Oxazepam (BZO) None Detected NONE DETECTED (Cut Off Level 300 ng/mL)   POC Cocaine UR None Detected NONE DETECTED (Cut Off Level 300 ng/mL)   POC Methamphetamine UR None Detected NONE DETECTED (Cut Off Level 1000 ng/mL)   POC Morphine None Detected NONE DETECTED (Cut Off Level 300 ng/mL)   POC Oxycodone UR None Detected NONE DETECTED (Cut Off Level 100 ng/mL)   POC Methadone UR None Detected NONE DETECTED (Cut Off Level 300 ng/mL)   POC Marijuana UR None Detected NONE DETECTED (Cut Off Level 50 ng/mL)    Blood Alcohol level:  Lab Results  Component Value Date   ETH <10 02/40/9735    Metabolic Disorder Labs:  Lab Results  Component Value Date   HGBA1C 5.7 (H) 04/05/2021   MPG 116.89 04/05/2021   Lab Results  Component Value Date    PROLACTIN 21.5 (H) 04/05/2021   Lab Results  Component Value Date   CHOL 180 04/05/2021   TRIG 119 04/05/2021   HDL 59 04/05/2021   CHOLHDL 3.1 04/05/2021   VLDL 24 04/05/2021   LDLCALC 97 04/05/2021  Current Medications: Current Facility-Administered Medications  Medication Dose Route Frequency Provider Last Rate Last Admin   acetaminophen (TYLENOL) tablet 650 mg  650 mg Oral Q6H PRN Rozetta Nunnery, NP       alum & mag hydroxide-simeth (MAALOX/MYLANTA) 200-200-20 MG/5ML suspension 30 mL  30 mL Oral Q4H PRN Rozetta Nunnery, NP       ARIPiprazole (ABILIFY) tablet 5 mg  5 mg Oral Daily Lindon Romp A, NP       clonazePAM (KLONOPIN) tablet 0.5 mg  0.5 mg Oral QHS Lindon Romp A, NP   0.5 mg at 04/06/21 2142   clonazePAM (KLONOPIN) tablet 0.5 mg  0.5 mg Oral Daily PRN Rozetta Nunnery, NP       hydrOXYzine (ATARAX/VISTARIL) tablet 25 mg  25 mg Oral TID PRN Rozetta Nunnery, NP       magnesium hydroxide (MILK OF MAGNESIA) suspension 30 mL  30 mL Oral Daily PRN Rozetta Nunnery, NP       traZODone (DESYREL) tablet 50 mg  50 mg Oral QHS Ethelene Hal, NP       venlafaxine XR (EFFEXOR-XR) 24 hr capsule 37.5 mg  37.5 mg Oral Daily Ethelene Hal, NP       PTA Medications: Medications Prior to Admission  Medication Sig Dispense Refill Last Dose   clonazePAM (KLONOPIN) 1 MG tablet Take 0.5-1 mg by mouth at bedtime.      mirtazapine (REMERON) 30 MG tablet Take 30 mg by mouth at bedtime.       Musculoskeletal: Strength & Muscle Tone: within normal limits Gait & Station: normal Patient leans: N/A  Psychiatric Specialty Exam:  Presentation  General Appearance: Appropriate for Environment; Casual  Eye Contact:Fair  Speech:Blocked  Speech Volume:Normal  Handedness:Right  Mood and Affect  Mood:Anxious; Depressed; Irritable; Labile  Affect:Labile; Constricted; Depressed  Thought Process  Thought Processes:Disorganized  Duration of Psychotic Symptoms: Less than six  months  Past Diagnosis of Schizophrenia or Psychoactive disorder: No  Descriptions of Associations:Tangential  Orientation:Full (Time, Place and Person)  Thought Content:Illogical; Rumination; Tangential; Paranoid Ideation  Hallucinations:Hallucinations: None  Ideas of Reference:Paranoia  Suicidal Thoughts:Suicidal Thoughts: No  Homicidal Thoughts:Homicidal Thoughts: No  Sensorium  Memory:Immediate Fair; Recent Fair; Remote Fair  Judgment:Poor  Insight:Lacking  Executive Functions  Concentration:Poor  Attention Span:Poor  [place]  Language:Good  Psychomotor Activity  Psychomotor Activity:Psychomotor Activity: Increased  Assets  Assets:Communication Skills; Financial Resources/Insurance; Housing; Resilience; Social Support  Sleep  Sleep:Sleep: Fair Number of Hours of Sleep: 5.5    Physical Exam: Physical Exam Vitals and nursing note reviewed.  HENT:     Head: Normocephalic.  Pulmonary:     Effort: Pulmonary effort is normal.  Musculoskeletal:        General: Normal range of motion.     Cervical back: Normal range of motion.  Neurological:     General: No focal deficit present.     Mental Status: He is alert and oriented to person, place, and time.  Psychiatric:        Attention and Perception: He does not perceive auditory or visual hallucinations.        Mood and Affect: Mood is anxious and depressed. Affect is angry.        Speech: Speech is tangential.        Behavior: Behavior is uncooperative and agitated.        Thought Content: Thought content is paranoid. Thought content does not include homicidal or suicidal  ideation. Thought content does not include homicidal or suicidal plan.        Cognition and Memory: Cognition is impaired.     Comments: Patient is experiencing thought blocking and paranoia. He denies hallucinations. He is irritable, angry, confused and argumentative. He stated he feels like his mind and  memory are "cloudy"     Review of Systems  Constitutional: Negative.  Negative for fever.  HENT: Negative.  Negative for congestion and sore throat.   Cardiovascular: Negative.   Gastrointestinal: Negative.   Genitourinary: Negative.   Musculoskeletal: Negative.   Neurological:  Positive for headaches.  Psychiatric/Behavioral:  Positive for depression and memory loss (describes his mind as "cloudy" with difficulty remebering and losing items). The patient is nervous/anxious and has insomnia.   Blood pressure 96/81, pulse (!) 103, temperature 97.6 F (36.4 C), temperature source Oral, resp. rate 16, height 6\' 1"  (1.854 m), weight 78.5 kg, SpO2 98 %. Body mass index is 22.82 kg/m.  Treatment Plan Summary: Daily contact with patient to assess and evaluate symptoms and progress in treatment and Medication management  Observation Level/Precautions:  15 minute checks  Laboratory:  Labs reviewed: CMP within normal limits, CBC with Diff within normal limits, Lipid profile within normal limits, Prolactin level 21.5, TSH 1.374, A1c 5.7, UDS and BAL negative, CT scan negative, MRI on 9/9 negative, EKG with QTc 434, Normal sinus rhythm Left posterior fascicular block Abnormal ECG Labs ordered: ANA, Ceruloplasmin, HIV, B12, Folate, Vitamin D, Repeat Prolactin, sed rate, Heavy metals  Psychotherapy:  Group Therapy  Medications:  See MAR  Consultations:  TBD  Discharge Concerns:  safety, medication compliance  Estimated LOS: 3-5 days  Other:     Physician Treatment Plan for Primary Diagnosis: MDD (major depressive disorder), single episode, severe with psychosis ([place]) Long Term Goal(s): Improvement in symptoms so as ready for discharge  Short Term Goals: Ability to identify changes in lifestyle to reduce recurrence of condition will improve, Ability to verbalize feelings will improve, Ability to disclose and discuss suicidal ideas, Ability to identify and develop effective coping behaviors will  improve, and Ability to identify triggers associated with substance abuse/mental health issues will improve  Physician Treatment Plan for Secondary Diagnosis: Principal Problem:   MDD (major depressive disorder), single episode, severe with psychosis ([place]) Active Problems:   Anxiety disorder, unspecified  Long Term Goal(s): Improvement in symptoms so as ready for discharge  Short Term Goals: Ability to identify changes in lifestyle to reduce recurrence of condition will improve, Ability to verbalize feelings will improve, Ability to disclose and discuss suicidal ideas, Ability to identify and develop effective coping behaviors will improve, Compliance with prescribed medications will improve, and Ability to identify triggers associated with substance abuse/mental health issues will improve  I certify that inpatient services furnished can reasonably be expected to improve the patient's condition.    Ethelene Hal, NP 10/15/202212:38 PM

## 2021-04-07 NOTE — [facility] Group Notes (Signed)
Pt did not attend Relaxation Group.

## 2021-04-07 NOTE — B[facility] Suicide Risk Assessment (Signed)
[place] INPATIENT:  Family/Significant Other Suicide Prevention Education  Suicide Prevention Education:  Education Completed; Joshua Lloyd (pt's wife) [phone] has been identified by the patient as the family member/significant other with whom the patient will be residing, and identified as the person(s) who will aid the patient in the event of a mental health crisis (suicidal ideations/suicide attempt).  With written consent from the patient, the family member/significant other has been provided the following suicide prevention education, prior to the and/or following the discharge of the patient.  The suicide prevention education provided includes the following: Suicide risk factors Suicide prevention and interventions National Suicide Hotline telephone number [facility] assessment telephone number [facility] Emergency Assistance [place] and/or Residential Mobile Crisis Unit telephone number  Request made of family/significant other to: Remove weapons (e.g., guns, rifles, knives), all items previously/currently identified as safety concern.   Remove drugs/medications (over-the-counter, prescriptions, illicit drugs), all items previously/currently identified as a safety concern.  The family member/significant other verbalizes understanding of the suicide prevention education information provided.  The family member/significant other agrees to remove the items of safety concern listed above.  Pt's mother/sons provided additional collateral (see psychosocial assessment note) and would like a call from the social worker on Monday with updates regarding pt's presentation and aftercare. They would also like to be notified on pt's dsicharge date to ensure he has transport home. They confirmed that there are no guns/weapons in the home. Pt's wife decided to lock up all knives and scissors "out of precaution." She also decided to set the expectation with Joshua Lloyd that he  MUST take his medications and attend follow-up appts in order to avoid going back to the hospital. If he refuses at any point, she will take him back to the hospital.   Avelina Laine, MSW, LCSW 04/07/2021, 3:58 PM

## 2021-04-07 NOTE — Progress Notes (Addendum)
D. Pt presented as very anxious, worried and paranoid. Pt refused his Ability, stating "I don't like the way these medications are making me feel." Pt did agree to flu shot, but this nurse had to convince him by showing him the [facility] screen to reassure him that he was indeed getting the flu shot as opposed to another medication. Per pt's self  inventory, pt rated his depression, hopelessness and anxiety a 0/1/0, respectively. Pt wrote that his goal today was "to relax and encourage others and myself by keep moving and see the positive in everything". Pt currently denies SI/HI and AVH , but does endorse paranoia. A. Labs and vitals monitored.  Pt supported emotionally and encouraged to express concerns and ask questions.   R. Pt remains safe with 15 minute checks. Will continue POC.

## 2021-04-08 LAB — FOLATE: Folate: 8.1 ng/mL (ref >5.9)

## 2021-04-08 LAB — VITAMIN B12: Vitamin B-12: 155 pg/mL — ABNORMAL LOW (ref 180–914)

## 2021-04-08 MED ORDER — CLONAZEPAM 0.5 MG PO TABS
0.5000 mg | ORAL_TABLET | Freq: Two times a day (BID) | ORAL | Status: DC
  Administered 2021-04-08 – 2021-04-10 (×3): 0.5 mg via ORAL
  Filled 2021-04-08 (×4): qty 1

## 2021-04-08 MED ORDER — ARIPIPRAZOLE 10 MG PO TABS
10.0000 mg | ORAL_TABLET | Freq: Every day | ORAL | Status: DC
  Administered 2021-04-09 – 2021-04-14 (×6): 10 mg via ORAL
  Filled 2021-04-08 (×9): qty 1

## 2021-04-08 NOTE — [facility] Group Notes (Signed)
Pt did not attend relaxation group this afternoon.

## 2021-04-08 NOTE — Progress Notes (Signed)
[facility] Group Notes:  (Nursing/MHT/Case Management/Adjunct)  Date:  04/08/2021  Time:  2015  Type of Therapy:   wrap up group  Participation Level:  Active  Participation Quality:  Appropriate, Attentive, Sharing, and Supportive  Affect:  Anxious  Cognitive:  Alert  Insight:  Improving  Engagement in Group:  Engaged  Modes of Intervention:  Clarification, Education, and Support  Summary of Progress/Problems: Positive thinking and self-care were discussed.   Shellia Cleverly 04/08/2021, 9:03 PM

## 2021-04-08 NOTE — Progress Notes (Addendum)
   04/08/21 1500  Psych Admission Type (Psych Patients Only)  Admission Status Voluntary  Psychosocial Assessment  Patient Complaints Anxiety  Eye Contact Fair  Facial Expression Anxious  Affect Preoccupied;Apprehensive  Speech Logical/coherent  Interaction Cautious  Motor Activity Other (Comment) (WDL)  Appearance/Hygiene Unremarkable  Behavior Characteristics Fidgety;Anxious  Mood Anxious;Apprehensive  Thought Process  Coherency Concrete thinking  Content Paranoia;Preoccupation  Delusions Paranoid  Perception Derealization  Hallucination None reported or observed  Judgment Impaired  Confusion Mild  Danger to Self  Current suicidal ideation? Denies  Danger to Others  Danger to Others None reported or observed   D. Pt presented as paranoid and suspicious upon initial approach, but reluctantly agreed to take his morning medications. Pt observed in the milieu throughout the day, and participated in group led by SW. Per pt's self inventory, pt rated his depression, hopelessness and anxiety a 2/1/1, respectively, and denied SI/HI and AVH Pt reported that his goal today was "to stay positive".  A. Labs and vitals monitored.  Pt supported emotionally and encouraged to express concerns and ask questions.   R. Pt remains safe with 15 minute checks. Will continue POC.

## 2021-04-08 NOTE — B[facility] Group Notes (Signed)
Pt attended morning group with the nurse.

## 2021-04-08 NOTE — Group Note (Unsigned)
Group Topic: Other  Group Date: 04/08/2021 Start Time: 0900 End Time: 1000 Facilitators: Paulino Rily, RN  Department: [facility] ADULT [facility]  Number of Participants: 16  Group Focus: anxiety and clarity of thought Treatment Modality:  Cognitive Behavioral Therapy, Skills Training, and Spiritual Interventions utilized were exploration and support Purpose: enhance coping skills and reinforce self-care   Name: Joshua Lloyd Date of Birth: Nov 28, 1960  MR: [id number]    Level of Participation: {THERAPIES; PSYCH GROUP PARTICIPATION GNOIB:70488} Quality of Participation: {THERAPIES; PSYCH QUALITY OF PARTICIPATION:23992} Interactions with others: {THERAPIES; PSYCH INTERACTIONS:23993} Mood/Affect: {THERAPIES; PSYCH MOOD/AFFECT:23994} Triggers (if applicable): *** Cognition: {THERAPIES; PSYCH COGNITION:23995} Progress: {THERAPIES; PSYCH PROGRESS:23997} Response: *** Plan: {THERAPIES; PSYCH QBVQ:94503}  Patients Problems:  Patient Active Problem List   Diagnosis Date Noted   MDD (major depressive disorder), single episode, severe with psychosis ([name]) 04/07/2021   Anxiety disorder, unspecified 04/07/2021   Psychosis ([name])    Prostate cancer ([name]) 10/17/2016

## 2021-04-08 NOTE — Group Note (Signed)
Late Note for 04/07/2021  Clinical Social Work Note  No group could be held this day due to low staffing.  Other licensed group was held.  Selmer Dominion, LCSW 04/07/2021

## 2021-04-08 NOTE — Group Note (Signed)
  [facility] LCSW Group Therapy Note  Date/Time:  04/08/2021 10:00AM-11:00AM  Type of Therapy and Topic:  Group Therapy:  Self-Care Wheel  Participation Level:  Active   Description of Group This process group involved patients discussing the importance of self-care in different areas of life (professional, personal, emotional, psychological, spiritual, and physical) in order to achieve healthy life balance.  The group talked about what self-care in each of those areas would constitute and then specifically listed how they want to provide themselves with improved self-care.  Therapeutic Goals Patient will learn how to break self-care down into various areas of life Patient will participate in generating ideas about healthy self-care options in each category Patients will be supportive of one another and receive support from others Patient will identify one healthy self-care activity to add to his/her life   Summary of Patient Progress:  The patient expressed one thing he/she can share about him/herself and because others in group expressed interest and support, this helped him/her to see how people are all connected in a variety of ways.  During the discussion about self-care, he/she shared very little although he/she did remain attentive and followed the discussion with his eyes.  He/She was able to identify ways in which self-care in different areas would be helpful.  Patient's insight seemed to be good, although he did not actually verbalize anything .  Therapeutic Modalities Processing Psychoeducation   Selmer Dominion, LCSW 04/08/2021 2:53 PM

## 2021-04-08 NOTE — Progress Notes (Signed)
[facility] MD Progress Note  04/08/2021 1:24 PM Joshua Lloyd  MRN:  [id number]  Subjective:  "I don't know about any of this, I just need to go home and not take medications"   Objective: Joshua Lloyd is a 60 year old male who presented , voluntarily to [facility] on 10/13 for a walk-in assessment. He was  accompanied by his wife, Joshua Lloyd. He was placed under IVC by the provider at [facility]. He was having difficulty at work, which started about 12 weeks ago, he had been under an immense amount of stress, which caused him to become paranoid and suspicious. He was admitted to [facility] for medication management and stabilization.   Evaluation on the unit today, 10/16: Patient was seen, chart reviewed and case discussed with the treatment team. Patient stated he thinks he slept okay last night, record reflects he slept 6 hours. He reported he is eating. He is attending group. Patient did take his Abilify and Effexor this morning after a lot of consideration and hesitation. He continues to be paranoid and delusional. He believes his toilet is the only one in the building without a seat or lid. He is unable to reason through anything and is reluctant to believe that we are trying to help him. He stated "Look you are just trying to medicate me to get me to do what you want, I want to go home and stop taking these medications and go back to work." I attempted to discuss with him that in his current state he is unable to function and he will not be able to work if he was at home. Patient is suspicious and disorganized. He appears guarded, he is restless and fidgety. He denied SI/HI/AVH. I attempted to discuss increasing the Effexor after tomorrow but he was not receptive to this. He also refused any lab work last night. He stated "10 vials is ridiculous, all of that stuff has been looked at already." Patient was moved to his own room today and has a no roommate order due to his extreme paranoia.   Collateral obtained form his  wife: Patient has been like this for the past 12 weeks after a big change in his job responsibilities and his inability ot perform with the new technology. She gave the same account of his medication trials prior to admission and stated she feels that the Remeron really made him worse. He has not been sleeping well at home. He is paranoid and she had to stop him from driving anymore. She stated he would do things like type an email but forget to push send. He was paranoid that people were monitoring him. He was repeating the same thing over and over. She stated he has no prior history of mental health problems, there is no family history. His maternal grandfather has a history of Alzheimer's but was in his 62's before it was diagnosed. She stated he is happy and healthy and loves his family. She stated she is glad he is getting help and she is aware she could not keep him at home like this. She stated she has not spoken to him since his admission because he doesn't believe it is her calling. Patient's wife is aware she can call for an update anytime.   Principal Problem: MDD (major depressive disorder), single episode, severe with psychosis ([place]) Diagnosis: Principal Problem:   MDD (major depressive disorder), single episode, severe with psychosis ([place]) Active Problems:   Anxiety disorder, unspecified  Total Time spent with  patient:  25 minutes  Past Psychiatric History: None prior to this hospitalization  Past Medical History:  Past Medical History:  Diagnosis Date   Cancer [facility])    prostate    Past Surgical History:  Procedure Laterality Date   NO PAST SURGERIES     ROBOT ASSISTED LAPAROSCOPIC RADICAL PROSTATECTOMY N/A 10/17/2016   Procedure: ROBOTIC ASSISTED LAPAROSCOPIC RADICAL PROSTATECTOMY LEVEL 2/ BILATERAL PELVIC LYMPHADENECTOMY;  Surgeon: Raynelle Bring, MD;  Location: [facility] ORS;  Service: Urology;  Laterality: N/A;   WRIST SURGERY     skiing accident when 60 years old   Family History:  History reviewed. No pertinent family history. Family Psychiatric  History: Maternal GF with Alzheimers in his 99's Social History:  Social History   Substance and Sexual Activity  Alcohol Use Yes   Alcohol/week: 1.0 standard drink   Types: 1 Glasses of wine per week   Comment: ocassionally     Social History   Substance and Sexual Activity  Drug Use No    Social History   Socioeconomic History   Marital status: Married    Spouse name: Not on file   Number of children: Not on file   Years of education: Not on file   Highest education level: Not on file  Occupational History   Not on file  Tobacco Use   Smoking status: Never   Smokeless tobacco: Never  Substance and Sexual Activity   Alcohol use: Yes    Alcohol/week: 1.0 standard drink    Types: 1 Glasses of wine per week    Comment: ocassionally   Drug use: No   Sexual activity: Yes  Other Topics Concern   Not on file  Social History Narrative   Not on file   Social Determinants of Health   Financial Resource Strain: Not on file  Food Insecurity: Not on file  Transportation Needs: Not on file  Physical Activity: Not on file  Stress: Not on file  Social Connections: Not on file   Additional Social History:      Sleep: Fair  Appetite:  Fair  Current Medications: Current Facility-Administered Medications  Medication Dose Route Frequency Provider Last Rate Last Admin   acetaminophen (TYLENOL) tablet 650 mg  650 mg Oral Q6H PRN Rozetta Nunnery, NP       alum & mag hydroxide-simeth (MAALOX/MYLANTA) 200-200-20 MG/5ML suspension 30 mL  30 mL Oral Q4H PRN Rozetta Nunnery, NP       [START ON 04/09/2021] ARIPiprazole (ABILIFY) tablet 10 mg  10 mg Oral Daily Ethelene Hal, NP       clonazePAM Bobbye [place]) tablet 0.5 mg  0.5 mg Oral Daily PRN Rozetta Nunnery, NP       clonazePAM (KLONOPIN) tablet 0.5 mg  0.5 mg Oral BID Ethelene Hal, NP       hydrOXYzine (ATARAX/VISTARIL) tablet 25 mg  25 mg Oral TID  PRN Lindon Romp A, NP       magnesium hydroxide (MILK OF MAGNESIA) suspension 30 mL  30 mL Oral Daily PRN Lindon Romp A, NP       traZODone (DESYREL) tablet 50 mg  50 mg Oral QHS Ethelene Hal, NP   50 mg at 04/07/21 2133   venlafaxine XR (EFFEXOR-XR) 24 hr capsule 37.5 mg  37.5 mg Oral Daily Ethelene Hal, NP   37.5 mg at 04/08/21 5809    Lab Results: No results found for this or any previous visit (from the past 48 hour(s)).  Blood Alcohol level:  Lab Results  Component Value Date   ETH <10 36/64/4034    Metabolic Disorder Labs: Lab Results  Component Value Date   HGBA1C 5.7 (H) 04/05/2021   MPG 116.89 04/05/2021   Lab Results  Component Value Date   PROLACTIN 21.5 (H) 04/05/2021   Lab Results  Component Value Date   CHOL 180 04/05/2021   TRIG 119 04/05/2021   HDL 59 04/05/2021   CHOLHDL 3.1 04/05/2021   VLDL 24 04/05/2021   Cecilia 97 04/05/2021    Physical Findings: AIMS:  , ,  ,  ,    CIWA:    COWS:     Musculoskeletal: Strength & Muscle Tone: within normal limits Gait & Station: normal Patient leans: N/A  Psychiatric Specialty Exam:  Presentation  General Appearance: Appropriate for Environment; Casual  Eye Contact:Fair  Speech:Blocked  Speech Volume:Normal  Handedness:Right   Mood and Affect  Mood:Anxious; Depressed; Irritable; Labile  Affect:Constricted; Depressed   Thought Process  Thought Processes:Disorganized; Coherent  Descriptions of Associations:Tangential  Orientation:Full (Time, Place and Person)  Thought Content:Illogical; Rumination; Tangential; Paranoid Ideation  History of Schizophrenia/Schizoaffective disorder:No  Duration of Psychotic Symptoms:Less than six months  Hallucinations:Hallucinations: None  Ideas of Reference:Paranoia  Suicidal Thoughts:Suicidal Thoughts: No  Homicidal Thoughts:Homicidal Thoughts: No  Sensorium  Memory:Immediate Fair; Recent Fair; Remote  Fair  Judgment:Poor  Insight:Lacking  Executive Functions  Concentration:Poor  Attention Span:Poor  [place]  Language:Good  Psychomotor Activity  Psychomotor Activity:Psychomotor Activity: Increased; Restlessness  Assets  Assets:Communication Skills; Financial Resources/Insurance; Housing; Resilience; Social Support; Physical Health; Vocational/Educational  Sleep  Sleep:Sleep: Fair Number of Hours of Sleep: 6  Physical Exam: Physical Exam Vitals and nursing note reviewed.  HENT:     Head: Normocephalic.  Pulmonary:     Effort: Pulmonary effort is normal.  Musculoskeletal:        General: Normal range of motion.     Cervical back: Normal range of motion.  Neurological:     General: No focal deficit present.     Mental Status: He is alert.   Review of Systems  Constitutional: Negative.  Negative for fever.  HENT: Negative.  Negative for congestion, sinus pain and sore throat.   Respiratory: Negative.  Negative for cough and shortness of breath.   Cardiovascular: Negative.  Negative for chest pain.  Gastrointestinal: Negative.   Genitourinary: Negative.   Musculoskeletal: Negative.   Neurological: Negative.   Psychiatric/Behavioral:  Positive for depression. The patient is nervous/anxious.    Blood pressure 108/77, pulse (!) 109, temperature 97.6 F (36.4 C), temperature source Oral, resp. rate 16, height 6\' 1"  (1.854 m), weight 78.5 kg, SpO2 96 %. Body mass index is 22.82 kg/m.   Treatment Plan Summary: Daily contact with patient to assess and evaluate symptoms and progress in treatment and Medication management  Labs ordered: ANA, Ceruloplasmin, HIV, B12, Folate, Vitamin D, Repeat Prolactin, sed rate, Heavy metals (patient refused labs on 10/15, will leave orders in place)  MDD, single episode, severe with psychotic features:  Patient received Abilify Maintena 400 mg IM on 10/12 form outpatient psychiatrist after one dose of  oral Abilify 5 mg -Initiate Abilify 5 mg PO daily (first dose on 10/16) -Increase Abilify to 10 mg daily starting 10/17 -Initiate Effexor 37.5 mg PO daily (10/15) titrate during hospitalization  Anxiety/Sleep: -Change Clonazepam 0.5 mg at bedtime and 0.5 mg PRN to 0.5 mg BID for anxiety and to help with sleep.  -Continue Vistaril 25 mg PO  TID PRN  Insomnia:  -Continue Trazodone 50 mg PO at bedtime PRN  Other PRN's: Milk of Magnesia, Mylanta, Tylenol  Patient moved to his own room (10/16) with No Roommate order due to extreme paranoid delusions Continue every 15 minute safety checks Encourage participation in the therapeutic milieu Discharge planning in progress  Ethelene Hal, NP 04/08/2021, 4:08 PM

## 2021-04-09 ENCOUNTER — Encounter (HOSPITAL_COMMUNITY): Payer: Self-pay

## 2021-04-09 LAB — VITAMIN D 25 HYDROXY (VIT D DEFICIENCY, FRACTURES): Vit D, 25-Hydroxy: 17.28 ng/mL — ABNORMAL LOW (ref 30–100)

## 2021-04-09 LAB — SEDIMENTATION RATE: Sed Rate: 95 mm/hr — ABNORMAL HIGH (ref 0–16)

## 2021-04-09 MED ORDER — VENLAFAXINE HCL ER 75 MG PO CP24
75.0000 mg | ORAL_CAPSULE | Freq: Every day | ORAL | Status: DC
  Filled 2021-04-09 (×3): qty 1

## 2021-04-09 NOTE — [facility] Group Notes (Signed)
Pt was able to attend AA/NA group. Pt was appropriate during tonight's group discussion wrap up group.

## 2021-04-09 NOTE — [facility] Group Notes (Signed)
Patient did not attend the Psycho-Ed group. 

## 2021-04-09 NOTE — Progress Notes (Signed)
CSW attempted to call wife, Teague Goynes, at patient family request to introduce CSW and provide updates about patient.  Patient wife did not answer. CSW left a HIPPA compliant voicemail with contact information for a call back.    Divit Stipp, LCSW, [place] Social Worker  [facility]

## 2021-04-09 NOTE — Group Note (Signed)
Occupational Therapy Group Note  Group Topic:Communication  Group Date: 04/09/2021 Start Time: 1400 End Time: 1445 Facilitators: Ponciano Ort, OT/L   Group Description: Group encouraged increased engagement and participation through discussion focused on communication styles. Patients were educated on the different styles of communication including passive, aggressive, assertive, and passive-aggressive communication. Group members shared and reflected on which styles they most often find themselves communicating in and brainstormed strategies on how to transition and practice a more assertive approach. Further discussion explored how to use assertiveness skills and strategies to further advocate and ask questions as it relates to their treatment plan and mental health.   Therapeutic Goal(s): Identify practical strategies to improve communication skills  Identify how to use assertive communication skills to address individual needs and wants   Participation Level: OT Group not held d/t high volume of individual OT/PT orders that needed to be seen.    Plan: Continue to engage patient in OT groups 2 - 3x/week.  04/09/2021  Ponciano Ort, OT/L

## 2021-04-09 NOTE — Progress Notes (Signed)
[facility] MD Progress Note  04/09/2021 3:03 PM Joshua Lloyd  MRN:  [id number]  Subjective:  "I don't know about any of this, I just need to go home and not take medications"   Objective: Joshua Lloyd is a 60 year old male who presented , voluntarily to [facility] on 10/13 for a walk-in assessment. He was  accompanied by his wife, Joshua Lloyd. He was placed under IVC by the provider at [facility]. He was having difficulty at work, which started about 12 weeks ago, he had been under an immense amount of stress, which caused him to become paranoid and suspicious. He was admitted to [facility] for medication management and stabilization.   Evaluation on the unit today, 10/17: Patient was seen, chart reviewed and case discussed with the treatment team. Patient appears to be functioning a bit better today. He is not as restless but still has paranoia about medications and the reason he is here. He continues to believe he can laeve, go home, not taking medication and return to work. He is still resistant to medications. He stated "I let everyone down and that is not acceptable." He believes that he let his family and his staff form his office down by not being able to preform his job duties. His son from [place] visited him yesterday and he stated the visit was okay. He is attending group therapy. Patient stated he thinks he slept okay last night, record reflects he slept 3.75 hours. He stated "someone kept opening my door last night." I went over the unit safety checks with him. He reported he is eating. He is attending group. Patient did take his increased dose of Abilify and Effexor this morning. I will increase his Effexor starting tomorrow.  He appears guarded, but less restless and fidgety. He denied SI/HI/AVH.I will reschedule his labs for this evening with the  possibility that he will allow lad to draw them. He was seen by Dr Nelda Marseille today and she did a MOCA test. 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 Patient was moved to his own room yesterday and has a no roommate order due to his extreme paranoia. Will continue to monitor fore safety and medication efficacy.   Collateral obtained form his wife on 10/16: Patient has been like this for the past 12 weeks after a big change in his job responsibilities and his inability ot perform with the new technology. She gave the same account of his medication trials prior to admission and stated she feels that the Remeron really made him worse. He has not been sleeping well at home. He is paranoid and she had to stop him from driving anymore. She stated he would do things like type an email but forget to push send. He was paranoid that people were monitoring him. He was repeating the same thing over and over. She stated he has no prior history of mental health problems, there is no family history. His maternal grandfather has a history of Alzheimer's but was in his 67's before it was diagnosed. She stated he is happy and healthy and loves his family. She stated she is glad he is getting help and she is aware she could not keep him at home like this. She stated she has not spoken to him since his admission because he doesn't believe it is her calling. Patient's wife is aware she can call for an update anytime.   Principal  Problem: MDD (major depressive disorder), single episode, severe with psychosis ([place]) Diagnosis: Principal Problem:   MDD (major depressive disorder), single episode, severe with psychosis ([place]) Active Problems:   Anxiety disorder, unspecified  Total Time spent with patient:  25 minutes  Past Psychiatric History: None prior to this hospitalization  Past Medical History:  Past Medical History:  Diagnosis Date   Cancer [facility])    prostate    Past Surgical History:  Procedure Laterality Date   NO PAST SURGERIES     ROBOT ASSISTED LAPAROSCOPIC RADICAL PROSTATECTOMY N/A 10/17/2016    Procedure: ROBOTIC ASSISTED LAPAROSCOPIC RADICAL PROSTATECTOMY LEVEL 2/ BILATERAL PELVIC LYMPHADENECTOMY;  Surgeon: Raynelle Bring, MD;  Location: [facility];  Service: Urology;  Laterality: N/A;   WRIST SURGERY     skiing accident when 60 years old   Family History: History reviewed. No pertinent family history. Family Psychiatric  History: Maternal GF with Alzheimers in his 50's Social History:  Social History   Substance and Sexual Activity  Alcohol Use Yes   Alcohol/week: 1.0 standard drink   Types: 1 Glasses of wine per week   Comment: ocassionally     Social History   Substance and Sexual Activity  Drug Use No    Social History   Socioeconomic History   Marital status: Married    Spouse name: Not on file   Number of children: Not on file   Years of education: Not on file   Highest education level: Not on file  Occupational History   Not on file  Tobacco Use   Smoking status: Never   Smokeless tobacco: Never  Substance and Sexual Activity   Alcohol use: Yes    Alcohol/week: 1.0 standard drink    Types: 1 Glasses of wine per week    Comment: ocassionally   Drug use: No   Sexual activity: Yes  Other Topics Concern   Not on file  Social History Narrative   Not on file   Social Determinants of Health   Financial Resource Strain: Not on file  Food Insecurity: Not on file  Transportation Needs: Not on file  Physical Activity: Not on file  Stress: Not on file  Social Connections: Not on file   Additional Social History:      Sleep: Fair  Appetite:  Fair  Current Medications: Current Facility-Administered Medications  Medication Dose Route Frequency Provider Last Rate Last Admin   acetaminophen (TYLENOL) tablet 650 mg  650 mg Oral Q6H PRN Rozetta Nunnery, NP       alum & mag hydroxide-simeth (MAALOX/MYLANTA) 200-200-20 MG/5ML suspension 30 mL  30 mL Oral Q4H PRN Lindon Romp A, NP       ARIPiprazole (ABILIFY) tablet 10 mg  10 mg Oral Daily Ethelene Hal, NP   10 mg at 04/09/21 0746   clonazePAM (KLONOPIN) tablet 0.5 mg  0.5 mg Oral Daily PRN Rozetta Nunnery, NP       clonazePAM (KLONOPIN) tablet 0.5 mg  0.5 mg Oral BID Ethelene Hal, NP   0.5 mg at 04/08/21 2137   hydrOXYzine (ATARAX/VISTARIL) tablet 25 mg  25 mg Oral TID PRN Rozetta Nunnery, NP       magnesium hydroxide (MILK OF MAGNESIA) suspension 30 mL  30 mL Oral Daily PRN Rozetta Nunnery, NP       traZODone (DESYREL) tablet 50 mg  50 mg Oral QHS Ethelene Hal, NP   50 mg at 04/08/21 2137   venlafaxine XR (EFFEXOR-XR)  24 hr capsule 37.5 mg  37.5 mg Oral Daily Ethelene Hal, NP   37.5 mg at 04/09/21 1062    Lab Results:  Results for orders placed or performed during the hospital encounter of 04/06/21 (from the past 48 hour(s))  Vitamin B12     Status: Abnormal   Collection Time: 04/08/21  6:33 PM  Result Value Ref Range   Vitamin B-12 155 (L) 180 - 914 pg/mL    Comment: (NOTE) This assay is not validated for testing neonatal or myeloproliferative syndrome specimens for Vitamin B12 levels. Performed at [facility], [place] [address], [place], [place] [address]   Folate, serum, performed at [facility] lab     Status: None   Collection Time: 04/08/21  6:33 PM  Result Value Ref Range   Folate 8.1 >5.9 ng/mL    Comment: Performed at [facility], [place] [address] [place], [place] [address]  VITAMIN D 25 Hydroxy (Vit-D Deficiency, Fractures)     Status: Abnormal   Collection Time: 04/08/21  6:33 PM  Result Value Ref Range   Vit D, 25-Hydroxy 17.28 (L) 30 - 100 ng/mL    Comment: (NOTE) Vitamin D deficiency has been defined by the [place] practice guideline as a level of serum 25-OH  vitamin D less than 20 ng/mL (1,2). The Endocrine Society went on to  further define vitamin D insufficiency as a level between 21 and 29  ng/mL (2).  1. IOM (Institute of Medicine). 2010. Dietary  reference intakes for  calcium and D. [place]: The [employer]. 2. Holick MF, Binkley [place], Bischoff-Ferrari HA, et al. Evaluation,  treatment, and prevention of vitamin D deficiency: an Endocrine  Society clinical practice guideline, JCEM. 2011 Jul; 96(7): 1911-30.  Performed at [facility], [place] [address] [place], [place] [address]     Blood Alcohol level:  Lab Results  Component Value Date   ETH <10 38/18/2993    Metabolic Disorder Labs: Lab Results  Component Value Date   HGBA1C 5.7 (H) 04/05/2021   MPG 116.89 04/05/2021   Lab Results  Component Value Date   PROLACTIN 21.5 (H) 04/05/2021   Lab Results  Component Value Date   CHOL 180 04/05/2021   TRIG 119 04/05/2021   HDL 59 04/05/2021   CHOLHDL 3.1 04/05/2021   VLDL 24 04/05/2021   [place] 97 04/05/2021    Physical Findings: AIMS:  , ,  ,  ,    CIWA:    COWS:     Musculoskeletal: Strength & Muscle Tone: within normal limits Gait & Station: normal Patient leans: N/A  Psychiatric Specialty Exam:  Presentation  General Appearance: Appropriate for Environment; Casual; Fairly Groomed  Eye Contact:Fair  Speech:Clear and Coherent; Normal Rate  Speech Volume:Normal  Handedness:Right   Mood and Affect  Mood:Anxious; Depressed  Affect:Constricted; Depressed   Thought Process  Thought Processes:Disorganized  Descriptions of Associations:Tangential  Orientation:Full (Time, Place and Person)  Thought Content:Illogical; Paranoid Ideation; Tangential  History of Schizophrenia/Schizoaffective disorder:No  Duration of Psychotic Symptoms:Less than six months  Hallucinations:Hallucinations: None  Ideas of Reference:Paranoia  Suicidal Thoughts:Suicidal Thoughts: No  Homicidal Thoughts:Homicidal Thoughts: No  Sensorium  Memory:Immediate Fair; Recent Fair; Remote Fair  Judgment:Poor  Insight:Lacking  Executive Functions  Concentration:Fair  Attention  Span:Fair  [place] of Knowledge:Good  Language:Good  Psychomotor Activity  Psychomotor Activity:Psychomotor Activity: Restlessness  Assets  Assets:Communication Skills; Desire for Improvement; Financial Resources/Insurance; Housing;  Physical Health; Resilience; Social Support; Vocational/Educational  Sleep  Sleep:Sleep: Poor Number of Hours of Sleep: 3.75  Physical Exam: Physical Exam Vitals and nursing note reviewed.  HENT:     Head: Normocephalic.  Pulmonary:     Effort: Pulmonary effort is normal.  Musculoskeletal:        General: Normal range of motion.     Cervical back: Normal range of motion.  Neurological:     General: No focal deficit present.     Mental Status: He is alert.   Review of Systems  Constitutional: Negative.  Negative for fever.  HENT: Negative.  Negative for congestion, sinus pain and sore throat.   Respiratory: Negative.  Negative for cough and shortness of breath.   Cardiovascular: Negative.  Negative for chest pain.  Gastrointestinal: Negative.   Genitourinary: Negative.   Musculoskeletal: Negative.   Neurological: Negative.   Psychiatric/Behavioral:  Positive for depression. The patient is nervous/anxious.    Blood pressure 105/76, pulse (!) 106, temperature 97.6 F (36.4 C), temperature source Oral, resp. rate 16, height 6\' 1"  (1.854 m), weight 78.5 kg, SpO2 97 %. Body mass index is 22.82 kg/m.   Treatment Plan Summary: Daily contact with patient to assess and evaluate symptoms and progress in treatment and Medication management  Labs ordered: ANA, Ceruloplasmin, HIV, B12, Folate, Vitamin D, Repeat Prolactin, sed rate, Heavy metals (patient refused labs on 10/15, will leave orders in place)  MDD, single episode, severe with psychotic features:  Patient received Abilify Maintena 400 mg IM on 10/12 form outpatient psychiatrist after one dose of oral Abilify 5 mg -Initiate Abilify 5 mg PO daily (first dose on 10/16) -Increase  Abilify to 10 mg daily starting 10/17 -Initiate Effexor 37.5 mg PO daily (10/15) titrate during hospitalization  Anxiety/Sleep: -Change Clonazepam 0.5 mg at bedtime and 0.5 mg PRN to 0.5 mg BID for anxiety and to help with sleep.  -Continue Vistaril 25 mg PO TID PRN  Insomnia:  -Continue Trazodone 50 mg PO at bedtime PRN  Other PRN's: Milk of Magnesia, Mylanta, Tylenol  Patient moved to his own room (10/16) with No Roommate order due to extreme paranoid delusions Continue every 15 minute safety checks Encourage participation in the therapeutic milieu Discharge planning in progress  Ethelene Hal, NP 04/09/2021, 3:03 PM

## 2021-04-09 NOTE — Progress Notes (Addendum)
   04/09/21 1400  Psych Admission Type (Psych Patients Only)  Admission Status Voluntary  Psychosocial Assessment  Patient Complaints Anxiety  Eye Contact Fair  Facial Expression Anxious  Affect Anxious;Preoccupied  Speech Logical/coherent  Interaction Cautious  Motor Activity Fidgety  Appearance/Hygiene Unremarkable  Behavior Characteristics Cooperative  Mood Anxious  Thought Process  Coherency Concrete thinking  Content Preoccupation  Delusions Paranoid  Perception Derealization  Hallucination None reported or observed  Judgment Impaired  Confusion Mild  Danger to Self  Current suicidal ideation? Denies  Danger to Others  Danger to Others None reported or observed  D. Pt presents less anxious and paranoid today- less resistant to taking his am meds. Pt did refuse his Klonopin, as he felt that he was too drowsy yesterday. Pt observed attending groups and going outside for rec therapy. Per pt's self inventory, pt rated his depression, hopelessness and anxiety a 2/3/1, respectively. Pt reported that his goal today was "to stay focused on the positive and attend groups". Pt currently denies SI/HI and AVH  A. Labs and vitals monitored. Pt iven and educated on medications. Pt supported emotionally and encouraged to express concerns and ask questions.

## 2021-04-09 NOTE — BH IP Treatment Plan (Signed)
Interdisciplinary Treatment and Diagnostic Plan Update  04/09/2021 Time of Session: 2:15pm Joshua Lloyd MRN: [id number]  Principal Diagnosis: MDD (major depressive disorder), single episode, severe with psychosis ([place])  Secondary Diagnoses: Principal Problem:   MDD (major depressive disorder), single episode, severe with psychosis ([place]) Active Problems:   Anxiety disorder, unspecified   Current Medications:  Current Facility-Administered Medications  Medication Dose Route Frequency Provider Last Rate Last Admin   acetaminophen (TYLENOL) tablet 650 mg  650 mg Oral Q6H PRN Rozetta Nunnery, NP       alum & mag hydroxide-simeth (MAALOX/MYLANTA) 200-200-20 MG/5ML suspension 30 mL  30 mL Oral Q4H PRN Lindon Romp A, NP       ARIPiprazole (ABILIFY) tablet 10 mg  10 mg Oral Daily Ethelene Hal, NP   10 mg at 04/09/21 0746   clonazePAM (KLONOPIN) tablet 0.5 mg  0.5 mg Oral Daily PRN Rozetta Nunnery, NP       clonazePAM (KLONOPIN) tablet 0.5 mg  0.5 mg Oral BID Ethelene Hal, NP   0.5 mg at 04/08/21 2137   hydrOXYzine (ATARAX/VISTARIL) tablet 25 mg  25 mg Oral TID PRN Rozetta Nunnery, NP       magnesium hydroxide (MILK OF MAGNESIA) suspension 30 mL  30 mL Oral Daily PRN Rozetta Nunnery, NP       traZODone (DESYREL) tablet 50 mg  50 mg Oral QHS Ethelene Hal, NP   50 mg at 04/08/21 2137   venlafaxine XR (EFFEXOR-XR) 24 hr capsule 37.5 mg  37.5 mg Oral Daily Ethelene Hal, NP   37.5 mg at 04/09/21 0744   PTA Medications: Medications Prior to Admission  Medication Sig Dispense Refill Last Dose   clonazePAM (KLONOPIN) 1 MG tablet Take 0.5-1 mg by mouth at bedtime.      mirtazapine (REMERON) 30 MG tablet Take 30 mg by mouth at bedtime.       Patient Stressors: Financial difficulties   Occupational concerns    Patient Strengths: Capable of independent living  Engineer, drilling fund of knowledge  Physical Health  Supportive  family/friends   Treatment Modalities: Medication Management, Group therapy, Case management,  1 to 1 session with clinician, Psychoeducation, Recreational therapy.   Physician Treatment Plan for Primary Diagnosis: MDD (major depressive disorder), single episode, severe with psychosis ([place]) Long Term Goal(s): Improvement in symptoms so as ready for discharge   Short Term Goals: Ability to identify changes in lifestyle to reduce recurrence of condition will improve Ability to verbalize feelings will improve Ability to disclose and discuss suicidal ideas Ability to identify and develop effective coping behaviors will improve Compliance with prescribed medications will improve Ability to identify triggers associated with substance abuse/mental health issues will improve  Medication Management: Evaluate patient's response, side effects, and tolerance of medication regimen.  Therapeutic Interventions: 1 to 1 sessions, Unit Group sessions and Medication administration.  Evaluation of Outcomes: Not Met  Physician Treatment Plan for Secondary Diagnosis: Principal Problem:   MDD (major depressive disorder), single episode, severe with psychosis ([place]) Active Problems:   Anxiety disorder, unspecified  Long Term Goal(s): Improvement in symptoms so as ready for discharge   Short Term Goals: Ability to identify changes in lifestyle to reduce recurrence of condition will improve Ability to verbalize feelings will improve Ability to disclose and discuss suicidal ideas Ability to identify and develop effective coping behaviors will improve Compliance with prescribed medications will improve Ability to identify triggers associated with  substance abuse/mental health issues will improve     Medication Management: Evaluate patient's response, side effects, and tolerance of medication regimen.  Therapeutic Interventions: 1 to 1 sessions, Unit Group sessions and Medication  administration.  Evaluation of Outcomes: Not Met   RN Treatment Plan for Primary Diagnosis: MDD (major depressive disorder), single episode, severe with psychosis ([place]) Long Term Goal(s): Knowledge of disease and therapeutic regimen to maintain health will improve  Short Term Goals: Ability to remain free from injury will improve, Ability to verbalize frustration and anger appropriately will improve, Ability to demonstrate self-control, Ability to participate in decision making will improve, Ability to identify and develop effective coping behaviors will improve, and Compliance with prescribed medications will improve  Medication Management: RN will administer medications as ordered by provider, will assess and evaluate patient's response and provide education to patient for prescribed medication. RN will report any adverse and/or side effects to prescribing provider.  Therapeutic Interventions: 1 on 1 counseling sessions, Psychoeducation, Medication administration, Evaluate responses to treatment, Monitor vital signs and CBGs as ordered, Perform/monitor CIWA, COWS, AIMS and Fall Risk screenings as ordered, Perform wound care treatments as ordered.  Evaluation of Outcomes: Not Met   LCSW Treatment Plan for Primary Diagnosis: MDD (major depressive disorder), single episode, severe with psychosis ([place]) Long Term Goal(s): Safe transition to appropriate next level of care at discharge, Engage patient in therapeutic group addressing interpersonal concerns.  Short Term Goals: Engage patient in aftercare planning with referrals and resources, Increase social support, Increase ability to appropriately verbalize feelings, Identify triggers associated with mental health/substance abuse issues, and Increase skills for wellness and recovery  Therapeutic Interventions: Assess for all discharge needs, 1 to 1 time with Social worker, Explore available resources and support systems, Assess for adequacy in  community support network, Educate family and significant other(s) on suicide prevention, Complete Psychosocial Assessment, Interpersonal group therapy.  Evaluation of Outcomes: Not Met   Progress in Treatment: Attending groups: Yes. Participating in groups: Yes. Taking medication as prescribed: Yes. Toleration medication: Yes. Family/Significant other contact made: Yes, individual(s) contacted:  wife Patient understands diagnosis: Yes. Discussing patient identified problems/goals with staff: Yes. Medical problems stabilized or resolved: Yes. Denies suicidal/homicidal ideation: Yes. Issues/concerns per patient self-inventory: No.   New problem(s) identified: No, Describe:  none  New Short Term/Long Term Goal(s): medication stabilization, elimination of SI thoughts, development of comprehensive mental wellness plan.    Patient Goals:  "Be more positive, be more engaging, get over remorseful thinking"  Discharge Plan or Barriers: Patient recently admitted. CSW will continue to follow and assess for appropriate referrals and possible discharge planning.    Reason for Continuation of Hospitalization: Anxiety Delusions  Depression Medication stabilization  Estimated Length of Stay: 3-5 days   Scribe for Treatment Team: Vassie Moselle, LCSW 04/09/2021 2:46 PM

## 2021-04-09 NOTE — Group Note (Signed)
LCSW Group Therapy Note   Group Date: 04/09/2021 Start Time: 1300 End Time: 1400   Type of Therapy and Topic:  Group Therapy:   Participation Level:  Active  Patients received a worksheet with an outline of 2 gingerbread men with a separation in the middle of the page. One sign designated what the pt sees about themselves and the other is what others see. Pts were asked to introduce themselves and share something they like about themself. Pts were then asked to draw, write or color how they view themselves as well as how they are viewed by others. CSW led discussion about the feelings and words associated with each side.   Patient Summary:   During introductions pt shared their name and stated they liked that he likes other people and can make friends easily about themselves. Pt was appropriate and participated in group discussion.   Darleen Crocker, LCSWA 04/09/2021  1:51 PM

## 2021-04-09 NOTE — Group Note (Signed)
Recreation Therapy Group Note   Group Topic:Stress Management  Group Date: 04/09/2021 Start Time: 0935 End Time: 0950 Facilitators: Victorino Sparrow, LRT/CTRS Location: [address] Dayroom  Goal Area(s) Addresses:  Patient will actively participate in stress management techniques presented during session.  Patient will successfully identify benefit of practicing stress management post d/c.   Group Description: Guided Imagery. LRT provided education, instruction, and demonstration on practice of visualization via guided imagery. Patient was asked to participate in the technique introduced during session. LRT debriefed including topics of mindfulness, stress management and specific scenarios each patient could use these techniques. Patients were given suggestions of ways to access scripts post d/c and encouraged to explore Youtube and other apps available on smartphones, tablets, and computers.  Affect/Mood: N/A   Participation Level: Did not attend    Clinical Observations/Individualized Feedback: Pt did not attend group.   Plan: Continue to engage patient in RT group sessions 2-3x/week.   Victorino Sparrow, LRT/CTRS 04/09/2021 12:51 PM

## 2021-04-09 NOTE — Progress Notes (Signed)
   04/09/21 0111  Psych Admission Type (Psych Patients Only)  Admission Status Voluntary  Psychosocial Assessment  Patient Complaints Anxiety  Eye Contact Fair  Facial Expression Anxious  Affect Anxious;Preoccupied  Speech Logical/coherent  Interaction Cautious  Motor Activity Fidgety  Appearance/Hygiene Unremarkable  Behavior Characteristics Fidgety;Anxious  Mood Anxious;Depressed  Thought Process  Coherency Concrete thinking  Content Paranoia;Preoccupation  Delusions Paranoid  Perception Derealization  Hallucination None reported or observed  Judgment Impaired  Confusion Mild  Danger to Self  Current suicidal ideation? Denies  Danger to Others  Danger to Others None reported or observed

## 2021-04-10 DIAGNOSIS — E538 Deficiency of other specified B group vitamins: Secondary | ICD-10-CM | POA: Diagnosis present

## 2021-04-10 DIAGNOSIS — E559 Vitamin D deficiency, unspecified: Secondary | ICD-10-CM | POA: Diagnosis present

## 2021-04-10 LAB — CERULOPLASMIN: Ceruloplasmin: 26.8 mg/dL (ref 16.0–31.0)

## 2021-04-10 LAB — ANA: Anti Nuclear Antibody (ANA): NEGATIVE

## 2021-04-10 LAB — HIV ANTIBODY (ROUTINE TESTING W REFLEX): HIV Screen 4th Generation wRfx: NONREACTIVE

## 2021-04-10 LAB — RPR: RPR Ser Ql: NONREACTIVE

## 2021-04-10 LAB — PROLACTIN: Prolactin: 21.7 ng/mL — ABNORMAL HIGH (ref 4.0–15.2)

## 2021-04-10 MED ORDER — LORAZEPAM 0.5 MG PO TABS
0.5000 mg | ORAL_TABLET | ORAL | Status: DC | PRN
  Filled 2021-04-10: qty 1

## 2021-04-10 MED ORDER — TEMAZEPAM 7.5 MG PO CAPS
7.5000 mg | ORAL_CAPSULE | Freq: Every evening | ORAL | Status: DC | PRN
  Administered 2021-04-10: 7.5 mg via ORAL
  Filled 2021-04-10: qty 1

## 2021-04-10 MED ORDER — VITAMIN D (ERGOCALCIFEROL) 1.25 MG (50000 UNIT) PO CAPS
50000.0000 [IU] | ORAL_CAPSULE | ORAL | Status: DC
  Filled 2021-04-10: qty 1

## 2021-04-10 MED ORDER — B COMPLEX-C PO TABS
1.0000 | ORAL_TABLET | Freq: Every day | ORAL | Status: DC
  Administered 2021-04-11 – 2021-04-14 (×4): 1 via ORAL
  Filled 2021-04-10 (×8): qty 1

## 2021-04-10 MED ORDER — MELATONIN 5 MG PO TABS
5.0000 mg | ORAL_TABLET | Freq: Every day | ORAL | Status: DC
  Administered 2021-04-11 – 2021-04-13 (×3): 5 mg via ORAL
  Filled 2021-04-10 (×7): qty 1

## 2021-04-10 MED ORDER — VITAMIN D (ERGOCALCIFEROL) 1.25 MG (50000 UNIT) PO CAPS
50000.0000 [IU] | ORAL_CAPSULE | ORAL | Status: DC
  Administered 2021-04-11: 50000 [IU] via ORAL
  Filled 2021-04-10: qty 1

## 2021-04-10 NOTE — Plan of Care (Signed)
Nurse discussed anxiety, depression and coping skills with patient.  

## 2021-04-10 NOTE — Progress Notes (Signed)
[facility] MD Progress Note  04/10/2021 5:24 PM Joshua Lloyd  MRN:  [id number]  Subjective:  "I don't know about any of this, I just need to go home and not take medications"   Objective: Joshua Lloyd is a 60 year old male who presented , voluntarily to [facility] on 10/13 for a walk-in assessment. He was  accompanied by his wife, Joshua Lloyd. He was placed under IVC by the provider at [facility]. He was having difficulty at work, which started about 12 weeks ago, he had been under an immense amount of stress, which caused him to become paranoid and suspicious. He was admitted to [facility] for medication management and stabilization.   Evaluation on the unit today, 10/18: Patient was seen, chart reviewed and case discussed with the treatment team.  The patient is more paranoid today, begins the interview by saying that he is not taking abilify and that he does not need medication. He is very irritable, accuses staff of purposely trying to humiliate him by the type of toilet, the metal construction of it, and that his clothes have not been washed and he has not shaved yet. After some assurances that he does not smell and that he is dressed appropriately (appears to be groomed), he calms down but remains easily distracted. He states that he came initially because "I had a little anxiety and depression, work related". He believes that this is resolved now, and that he no longer has this. He states now that his biggest problem is "they've changed it around too many times and that's what's got me all confused". He struggles with word finding, naming, throughout interview. Discuss option for neuropsych testing and this appears to not be something he feels he needs to do. He expresses that he his plan is to return to work, and that he anticipates no difficulties.   Principal Problem: MDD (major depressive disorder), single episode, severe with psychosis ([place]) Diagnosis: Principal Problem:   MDD (major depressive disorder), single  episode, severe with psychosis ([place]) Active Problems:   Anxiety disorder, unspecified  Total Time spent with patient:  25 minutes  Past Psychiatric History: None prior to this hospitalization  Past Medical History:  Past Medical History:  Diagnosis Date   Cancer [facility]    prostate    Past Surgical History:  Procedure Laterality Date   NO PAST SURGERIES     ROBOT ASSISTED LAPAROSCOPIC RADICAL PROSTATECTOMY N/A 10/17/2016   Procedure: ROBOTIC ASSISTED LAPAROSCOPIC RADICAL PROSTATECTOMY LEVEL 2/ BILATERAL PELVIC LYMPHADENECTOMY;  Surgeon: Raynelle Bring, MD;  Location: [facility];  Service: Urology;  Laterality: N/A;   WRIST SURGERY     skiing accident when 60 years old   Family History: History reviewed. No pertinent family history. Family Psychiatric  History: Maternal GF with Alzheimers in his 25's Social History:  Social History   Substance and Sexual Activity  Alcohol Use Yes   Alcohol/week: 1.0 standard drink   Types: 1 Glasses of wine per week   Comment: ocassionally     Social History   Substance and Sexual Activity  Drug Use No    Social History   Socioeconomic History   Marital status: Married    Spouse name: Not on file   Number of children: Not on file   Years of education: Not on file   Highest education level: Not on file  Occupational History   Not on file  Tobacco Use   Smoking status: Never   Smokeless tobacco: Never  Substance and Sexual  Activity   Alcohol use: Yes    Alcohol/week: 1.0 standard drink    Types: 1 Glasses of wine per week    Comment: ocassionally   Drug use: No   Sexual activity: Yes  Other Topics Concern   Not on file  Social History Narrative   Not on file   Social Determinants of Health   Financial Resource Strain: Not on file  Food Insecurity: Not on file  Transportation Needs: Not on file  Physical Activity: Not on file  Stress: Not on file  Social Connections: Not on file   Additional Social History:      Sleep:  Fair  Appetite:  Fair  Current Medications: Current Facility-Administered Medications  Medication Dose Route Frequency Provider Last Rate Last Admin   acetaminophen (TYLENOL) tablet 650 mg  650 mg Oral Q6H PRN Rozetta Nunnery, NP       alum & mag hydroxide-simeth (MAALOX/MYLANTA) 200-200-20 MG/5ML suspension 30 mL  30 mL Oral Q4H PRN Lindon Romp A, NP       ARIPiprazole (ABILIFY) tablet 10 mg  10 mg Oral Daily Ethelene Hal, NP   10 mg at 04/10/21 9242   clonazePAM (KLONOPIN) tablet 0.5 mg  0.5 mg Oral Daily PRN Rozetta Nunnery, NP       clonazePAM (KLONOPIN) tablet 0.5 mg  0.5 mg Oral BID Ethelene Hal, NP   0.5 mg at 04/10/21 0957   hydrOXYzine (ATARAX/VISTARIL) tablet 25 mg  25 mg Oral TID PRN Rozetta Nunnery, NP       magnesium hydroxide (MILK OF MAGNESIA) suspension 30 mL  30 mL Oral Daily PRN Rozetta Nunnery, NP       traZODone (DESYREL) tablet 50 mg  50 mg Oral QHS Ethelene Hal, NP   50 mg at 04/09/21 2138   venlafaxine XR (EFFEXOR-XR) 24 hr capsule 75 mg  75 mg Oral Daily Ethelene Hal, NP        Lab Results:  Results for orders placed or performed during the hospital encounter of 04/06/21 (from the past 48 hour(s))  ANA     Status: None   Collection Time: 04/08/21  6:33 PM  Result Value Ref Range   Anti Nuclear Antibody (ANA) Negative Negative    Comment: (NOTE) Performed At: [facility] [place], [place] [id number] Rush Farmer MD [id number]   Ceruloplasmin     Status: None   Collection Time: 04/08/21  6:33 PM  Result Value Ref Range   Ceruloplasmin 26.8 16.0 - 31.0 mg/dL    Comment: (NOTE) Performed At: [facility] [place], [place] [id number] Rush Farmer MD EH:[id number]   Vitamin B12     Status: Abnormal   Collection Time: 04/08/21  6:33 PM  Result Value Ref Range   Vitamin B-12 155 (L) 180 - 914 pg/mL    Comment: (NOTE) This assay is not validated for testing neonatal  or myeloproliferative syndrome specimens for Vitamin B12 levels. Performed at [facility], [place] [address] [place], [place] [address]   Folate, serum, performed at [facility] lab     Status: None   Collection Time: 04/08/21  6:33 PM  Result Value Ref Range   Folate 8.1 >5.9 ng/mL    Comment: Performed at [facility], [place] [address], [place], [place] [address]  VITAMIN D 25 Hydroxy (Vit-D Deficiency, Fractures)     Status: Abnormal   Collection Time: 04/08/21  6:33 PM  Result Value Ref Range   Vit D, 25-Hydroxy 17.28 (L) 30 - 100 ng/mL    Comment: (NOTE) Vitamin D deficiency has been defined by the [place] practice guideline as a level of serum 25-OH  vitamin D less than 20 ng/mL (1,2). The Endocrine Society went on to  further define vitamin D insufficiency as a level between 21 and 29  ng/mL (2).  1. IOM (Institute of Medicine). 2010. Dietary reference intakes for  calcium and D. [place]: The [employer]. 2. Holick MF, Binkley Kirkwood, Bischoff-Ferrari HA, et al. Evaluation,  treatment, and prevention of vitamin D deficiency: an Endocrine  Society clinical practice guideline, JCEM. 2011 Jul; 96(7): 1911-30.  Performed at [facility], [place] [address] [place], [place] [address]   Prolactin     Status: Abnormal   Collection Time: 04/08/21  6:33 PM  Result Value Ref Range   Prolactin 21.7 (H) 4.0 - 15.2 ng/mL    Comment: (NOTE) Performed At: [facility] [place], [place] [id number] Rush Farmer MD QM:[id number]   HIV Antibody (routine testing w rflx)     Status: None   Collection Time: 04/09/21  7:09 PM  Result Value Ref Range   HIV Screen 4th Generation wRfx Non Reactive Non Reactive    Comment: Performed at [facility], [place] [address] [place], [place] [address]  RPR     Status: None   Collection Time: 04/09/21  7:09 PM  Result Value Ref  Range   RPR Ser Ql NON REACTIVE NON REACTIVE    Comment: Performed at [facility], [place] [address], [place], [place] [address]  Sedimentation rate     Status: Abnormal   Collection Time: 04/09/21  7:09 PM  Result Value Ref Range   Sed Rate 95 (H) 0 - 16 mm/hr    Comment: 04/09/21 @ 2205 VS Performed at [facility], [place] [address] [place],  [address]     Blood Alcohol level:  Lab Results  Component Value Date   ETH <10 82/50/5397    Metabolic Disorder Labs: Lab Results  Component Value Date   HGBA1C 5.7 (H) 04/05/2021   MPG 116.89 04/05/2021   Lab Results  Component Value Date   PROLACTIN 21.7 (H) 04/08/2021   PROLACTIN 21.5 (H) 04/05/2021   Lab Results  Component Value Date   CHOL 180 04/05/2021   TRIG 119 04/05/2021   HDL 59 04/05/2021   CHOLHDL 3.1 04/05/2021   VLDL 24 04/05/2021   [place] 97 04/05/2021    Physical Findings: AIMS:  , ,  ,  ,    CIWA:    COWS:     Musculoskeletal: Strength & Muscle Tone: within normal limits Gait & Station: normal Patient leans: N/A  Psychiatric Specialty Exam:  Presentation  General Appearance: Appropriate for Environment; Casual; Fairly Groomed  Eye Contact:Fair  Speech:Clear and Coherent; Normal Rate  Speech Volume:Normal  Handedness:Right   Mood and Affect  Mood:Anxious; Depressed  Affect:Constricted; Depressed   Thought Process  Thought Processes:Disorganized  Descriptions of Associations:Tangential  Orientation:Full (Time, Place and Person)  Thought Content:Illogical; Paranoid Ideation; Tangential  History of Schizophrenia/Schizoaffective disorder:No  Duration of Psychotic Symptoms:Less than six months  Hallucinations:Hallucinations: None  Ideas of Reference:Paranoia  Suicidal Thoughts:Suicidal Thoughts: No  Homicidal Thoughts:Homicidal Thoughts: No  Sensorium  Memory:Immediate Fair; Recent Fair; Remote  Fair  Judgment:Poor  Insight:Lacking  Executive Functions  Concentration:Fair  Attention  Span:Fair  [place] of Knowledge:Good  Language:Good  Psychomotor Activity  Psychomotor Activity:Psychomotor Activity: Restlessness  Assets  Assets:Communication Skills; Desire for Improvement; Financial Resources/Insurance; Housing; Physical Health; Resilience; Social Support; Vocational/Educational  Sleep  Sleep:Sleep: Poor Number of Hours of Sleep: 3.75  Physical Exam: Physical Exam Vitals and nursing note reviewed.  HENT:     Head: Normocephalic.  Pulmonary:     Effort: Pulmonary effort is normal.  Musculoskeletal:        General: Normal range of motion.     Cervical back: Normal range of motion.  Neurological:     General: No focal deficit present.     Mental Status: He is alert.  Psychiatric:        Mood and Affect: Mood is anxious.        Thought Content: Thought content is paranoid and delusional.        Judgment: Judgment is impulsive.   Review of Systems  Constitutional: Negative.  Negative for fever.  HENT: Negative.  Negative for congestion, sinus pain and sore throat.   Respiratory: Negative.  Negative for cough and shortness of breath.   Cardiovascular: Negative.  Negative for chest pain.  Gastrointestinal: Negative.   Genitourinary: Negative.   Musculoskeletal: Negative.   Neurological: Negative.   Psychiatric/Behavioral:  Positive for depression. The patient is nervous/anxious.    Blood pressure 137/87, pulse 90, temperature 97.8 F (36.6 C), resp. rate 16, height 6\' 1"  (1.854 m), weight 78.5 kg, SpO2 100 %. Body mass index is 22.82 kg/m.   Treatment Plan Summary: Daily contact with patient to assess and evaluate symptoms and progress in treatment and Medication management  Labs ordered: ANA, Ceruloplasmin, HIV, B12, Folate, Vitamin D, Repeat Prolactin, sed rate, Heavy metals (patient refused labs on 10/15, will leave orders in  place)  MDD, single episode, severe with psychotic features:  Patient received Abilify Maintena 400 mg IM on 10/12 -Abilify 10 mg daily  -discontinue Effexor - patient refusing  Anxiety/Sleep: -stop clonazepam - patient inconsistent/refusing. May be contributing to complaints of confusion and sedation.  -discontinue vistaril - anticholinergic can contribute to confusion in elderly -Start melatonin 5mg  pO QHS -Start restoril 7.5mg  PO QHS PRN Insomnia -stop trazodone due to lack of efficacy  Nutrition: Ergocalciferol 50 000 units PO weekly for vitamin D deficienct Stress B vitamin for B12 (and likely other Bs) deficiency  Other PRN's: Milk of Magnesia, Mylanta, Tylenol  Patient moved to his own room (10/16) with No Roommate order due to extreme paranoid delusions Continue every 15 minute safety checks Encourage participation in the therapeutic milieu Discharge planning in progress  Maida Sale, MD 04/10/2021, 5:24 PM

## 2021-04-10 NOTE — Progress Notes (Signed)
D:  [name] did attend evening AA group.  He rated his day overall a 10/10 because "this is the best day that I have had since I have been here."  He rated his depression and anxiety 3/10 (10 the worst).  He denied SI/HI or AVH.  He denied any paranoia.  He took his bedtime medications without difficulty.   A:  1:1 with RN for support and encouragement.  Medications given as ordered.  Q 15 minute checks maintained for safety.  Encouraged participation in group and unit activities.   R:  He is currently resting with his eyes closed and appears to be asleep.  He remains safe on the unit.  We will continue to monitor the progress towards his goals.   04/10/21 0000  Psych Admission Type (Psych Patients Only)  Admission Status Voluntary  Psychosocial Assessment  Patient Complaints Anxiety;Depression  Eye Contact Fair  Facial Expression Anxious  Affect Anxious;Preoccupied  Speech Logical/coherent  Interaction Cautious  Motor Activity Other (Comment) (unremarkable)  Appearance/Hygiene Unremarkable  Behavior Characteristics Cooperative;Appropriate to situation  Mood Anxious;Depressed  Thought Process  Coherency Concrete thinking  Content Preoccupation  Delusions None reported or observed  Perception Derealization  Hallucination None reported or observed  Judgment Impaired  Confusion Mild  Danger to Self  Current suicidal ideation? Denies  Danger to Others  Danger to Others None reported or observed

## 2021-04-10 NOTE — BHH Group Notes (Signed)
The focus of this group is to help patients establish daily goals to achieve during treatment and discuss how the patient can incorporate goal setting into their daily lives to aide in recovery.  Pt did not attend morning goals group. But talked to the MHT 1:1 and stated that his goal for today was to come up with coping skills.

## 2021-04-10 NOTE — Progress Notes (Signed)
Adult Psychoeducational Group Note  Date:  04/10/2021 Time:  9:23 PM  Group Topic/Focus:  Wrap-Up Group:   The focus of this group is to help patients review their daily goal of treatment and discuss progress on daily workbooks.  Participation Level:  Active  Participation Quality:  Appropriate  Affect:  Appropriate  Cognitive:  Appropriate  Insight: Appropriate  Engagement in Group:  Improving  Modes of Intervention:  Discussion  Additional Comments:  Pt stated his goal for today was to focus on his treatment plan. Pt stated he accomplished his goal today. Pt stated he talked with his doctor and social worker about his care today. Pt rated his overall day a 3 out of 10. Pt state he has been feeling very tired today. Pt stated he was able to contact his mother and wife today. Pt stated he felt better about himself today. Pt stated he was able to attend all meals. Pt stated he took all medications provided today. Pt stated he attend all groups held today. Pt stated his appetite was poor today. Pt rated sleep last night was poor. Pt stated the goal tonight was to get some rest. Pt stated he had no physical pain today. Pt deny visual hallucinations and auditory issues tonight. Pt denies thoughts of harming himself or others. Pt stated he would alert staff if anything changed.  Candy Sledge 04/10/2021, 9:23 PM

## 2021-04-10 NOTE — Progress Notes (Signed)
   04/10/21 2055  Psych Admission Type (Psych Patients Only)  Admission Status Involuntary  Psychosocial Assessment  Patient Complaints Other (Comment) (feeling remorseful-didn't elaborate as to what for)  Eye Contact Fair  Facial Expression Anxious;Worried  Affect Anxious;Preoccupied  Speech Logical/coherent;Pressured  Interaction Cautious  Motor Activity Other (Comment) (wnl)  Appearance/Hygiene Unremarkable  Behavior Characteristics Anxious;Irritable  Mood Anxious;Irritable;Preoccupied  Thought Process  Coherency Concrete thinking  Content Preoccupation;Blaming others (blaming "[place]" for changing his medication)  Delusions None reported or observed  Perception WDL  Hallucination None reported or observed  Judgment Impaired  Confusion None  Danger to Self  Current suicidal ideation? Denies  Danger to Others  Danger to Others None reported or observed   Pt seen in the dayroom. Pt appears anxious and cautious. Pt denies SI, HI, AVH and pain. Pt denies anxiety and depression. Says he only feels "remorseful" but doesn't elaborate as to what he is remorseful about. Pt says he had a bowel movement today for the first time in 2 days. Pt says he is very tired and hasn't been sleeping well. Pt informed that his medications for the evening and became agitated when he realized his anxiety and sleep medications had changed. Pt says he didn't know and wasn't informed of these changes. "Why do you all keep doing this? I can't keep up with all the changes in the medications. I feel like a prisoner of war here.I'm so tired. I just want to go to sleep." Pt walks to his room and gets a piece of paper with the names of two medications on it that he takes for sleep at home - Remeron and Klonopin. Pt encouraged to speak to provider tomorrow. Explained his new medications - Restoril and Ativan and melatonin. Pt not ready to receive teaching. Pt refused melatonin. Pt reluctantly agreed to take Restoril for  sleep. Will continue to monitor.

## 2021-04-10 NOTE — Group Note (Signed)
Recreation Therapy Group Note   Group Topic:Animal Assisted Therapy   Group Date: 04/10/2021 Start Time: 1430 End Time: 1510 Facilitators: Victorino Sparrow, LRT/CTRS Location: [place]  AAA/T Program Assumption of Risk Form signed by Patient/ or Parent Legal Guardian Yes  Patient understands his/her participation is voluntary Yes   Affect/Mood: N/A   Participation Level: Did not attend    Clinical Observations/Individualized Feedback: Pt did not attend group.    Plan: Continue to engage patient in RT group sessions 2-3x/week.   Victorino Sparrow, LRT/CTRS 04/10/2021 4:08 PM

## 2021-04-10 NOTE — Progress Notes (Signed)
CSW spoke with patient wife, Joshua Lloyd, regarding patient care and updates.  CSW offered current state of patient and ongoing ways to support patient.  CSW provided wife medication updates.  Wife discussed different questions she had including lab results as well as follow up referrrals and appointments after discharge.    Joshua Lloyd discussed wanting someone to make sure patient knew he had access to clothes after  he goes outside and the importance of hygeine to patient.  Joshua Lloyd also discussed patient having some paranoia around who she was at visitation as well as 2 men talking about business deals across the hall from him. Joshua Lloyd requested that a physician call her with an update occasionally to discuss treatment and care.  CSW agreed to let a physician know.    Tavaris Eudy, LCSW, [place] Social Worker  [facility]

## 2021-04-10 NOTE — Progress Notes (Signed)
D:  Patient's self inventory sheet, patient has fair sleep, sleep medication helpful.  Fair appetite, normal energy level, good concentration.  Rated depression, hopeless and anxiety 2.  Denied withdrawals.  Denied SI.  Denied physical problems.  Denied physical pain.  Goal is stay positive.  P lans to stay positive.  No discharge plans.  A:  Medications administered per MD orders. Emotional support and encouragement given patient. R:  Denied SI and HI, contracts for safety.  Denied A/V hallucinations.  Denied pain.  Safety maintained with 15 minute checks.

## 2021-04-11 ENCOUNTER — Telehealth (HOSPITAL_COMMUNITY): Payer: Self-pay | Admitting: Internal Medicine

## 2021-04-11 DIAGNOSIS — R4689 Other symptoms and signs involving appearance and behavior: Secondary | ICD-10-CM | POA: Diagnosis present

## 2021-04-11 DIAGNOSIS — R4189 Other symptoms and signs involving cognitive functions and awareness: Secondary | ICD-10-CM | POA: Diagnosis present

## 2021-04-11 LAB — HEAVY METALS, BLOOD
Arsenic: 1 ug/L (ref 0–9)
Arsenic: 1 ug/L (ref 0–9)
Lead: 1.1 ug/dL (ref 0.0–3.4)
Lead: 1 ug/dL (ref 0.0–3.4)
Mercury: 5 ug/L (ref 0.0–14.9)
Mercury: 3.8 ug/L (ref 0.0–14.9)

## 2021-04-11 LAB — CERULOPLASMIN: Ceruloplasmin: 18.4 mg/dL (ref 16.0–31.0)

## 2021-04-11 LAB — PROLACTIN: Prolactin: 25.3 ng/mL — ABNORMAL HIGH (ref 4.0–15.2)

## 2021-04-11 LAB — ANA: Anti Nuclear Antibody (ANA): NEGATIVE

## 2021-04-11 MED ORDER — MIRTAZAPINE 15 MG PO TABS
15.0000 mg | ORAL_TABLET | Freq: Every day | ORAL | Status: DC
  Administered 2021-04-11 – 2021-04-13 (×3): 15 mg via ORAL
  Filled 2021-04-11 (×6): qty 1

## 2021-04-11 MED ORDER — TEMAZEPAM 15 MG PO CAPS: 15.0000 mg | ORAL_CAPSULE | Freq: Every evening | ORAL | Status: DC | PRN

## 2021-04-11 NOTE — Plan of Care (Signed)
Nurse discussed anxiety, depression and coping skills with patient.  

## 2021-04-11 NOTE — Group Note (Signed)
Recreation Therapy Group Note   Group Topic:Stress Management  Group Date: 04/11/2021 Start Time: 0930 End Time: 0945 Facilitators: Victorino Sparrow, LRT/CTRS Location: [address] Dayroom  Goal Area(s) Addresses:  Patient will identify positive stress management techniques. Patient will identify benefits of using stress management post d/c.   Group Description:  Meditation.  LRT played a meditation that focused on setting personal boundaries for self care.  The meditation focused on learning to say no for things you don't want to do instead of trying to make others happy at the expense of yourself.   Affect/Mood: N/A   Participation Level: Did not attend    Clinical Observations/Individualized Feedback: Pt did not attend group.    Plan: Continue to engage patient in RT group sessions 2-3x/week.   Victorino Sparrow, LRT/CTRS  04/11/2021 11:38 AM

## 2021-04-11 NOTE — Progress Notes (Signed)
[facility] MD Progress Note  04/11/2021 12:24 PM Joshua Lloyd  MRN:  [id number]  Subjective:  "I don't know about any of this, I just need to go home and not take medications"   Objective: Joshua Lloyd is a 60 year old male who presented , voluntarily to [facility] on 10/13 for a walk-in assessment. He was  accompanied by his wife, Joshua Lloyd. He was placed under IVC by the provider at [facility]. He was having difficulty at work, which started about 12 weeks ago, he had been under an immense amount of stress, which caused him to become paranoid and suspicious. He was admitted to [facility] for medication management and stabilization.   Evaluation on the unit today, 10/19: Patient was seen, chart reviewed and case discussed with the treatment team.  The patient continues to be paranoid today, he begins the interview with "I haven't been able to brush my teeth in a week and I am exhausted." When questioned about not being able to brush his teeth he stated "the toothbrush is short and the toothpaste tube is too small." He continues to talk about the toilet not having a seat or lid and asks why we are doing this to people. He stated slept very little last night because of all of the things we are doing to people here. Record reflects he slept  4.75 hours. He is easily irritated and feels the explanations offered are excuses. When asked about his appetite he stated "I am trying to eat. At this point I am just glad we have food, everything else is rationed, I can't even shave." He feels the water is being rationed. I explained he can ask for water anytime he wants and he replied "everyone else has a picture of water in their room." He again says he is exhausted. He is denying anxiety and depression. He is clearly anxious. He also denies paranoia but he is paranoid that we are targeting him and treating him differently. He stated "I can't shower because when I am getting dressed everyone can walk by and see me." I reminded him he can  close his door to get dressed. He is very irritable and easily distracted. He becomes frustrated when he can not find the words to explain himself and will state "It doesn't matter, you guys are winning and I give up." He does recall the day of the week and the month correctly but is annoyed by this line of questioning. He stated he has talked to his wife on the phone and she has visited. He stated the visits are going fine and they talk about many things but can not tell me what they have discussed. He continues to stated that he just wants to stop taking medication and return to work.   Principal Problem: MDD (major depressive disorder), single episode, severe with psychosis ([place]) Diagnosis: Principal Problem:   MDD (major depressive disorder), single episode, severe with psychosis ([place]) Active Problems:   Anxiety disorder, unspecified   B12 deficiency   Vitamin D deficiency  Total Time spent with patient:  25 minutes  Past Psychiatric History: None prior to this hospitalization  Past Medical History:  Past Medical History:  Diagnosis Date   Cancer [facility])    prostate    Past Surgical History:  Procedure Laterality Date   NO PAST SURGERIES     ROBOT ASSISTED LAPAROSCOPIC RADICAL PROSTATECTOMY N/A 10/17/2016   Procedure: ROBOTIC ASSISTED [place] 2/ BILATERAL PELVIC LYMPHADENECTOMY;  Surgeon: Raynelle Bring,  MD;  Location: [facility] ORS;  Service: Urology;  Laterality: N/A;   WRIST SURGERY     skiing accident when 60 years old   Family History: History reviewed. No pertinent family history. Family Psychiatric  History: Maternal GF with Alzheimers in his 57's Social History:  Social History   Substance and Sexual Activity  Alcohol Use Yes   Alcohol/week: 1.0 standard drink   Types: 1 Glasses of wine per week   Comment: ocassionally     Social History   Substance and Sexual Activity  Drug Use No    Social History   Socioeconomic History   Marital  status: Married    Spouse name: Not on file   Number of children: Not on file   Years of education: Not on file   Highest education level: Not on file  Occupational History   Not on file  Tobacco Use   Smoking status: Never   Smokeless tobacco: Never  Substance and Sexual Activity   Alcohol use: Yes    Alcohol/week: 1.0 standard drink    Types: 1 Glasses of wine per week    Comment: ocassionally   Drug use: No   Sexual activity: Yes  Other Topics Concern   Not on file  Social History Narrative   Not on file   Social Determinants of Health   Financial Resource Strain: Not on file  Food Insecurity: Not on file  Transportation Needs: Not on file  Physical Activity: Not on file  Stress: Not on file  Social Connections: Not on file   Additional Social History:      Sleep: Fair  Appetite:  Fair  Current Medications: Current Facility-Administered Medications  Medication Dose Route Frequency Provider Last Rate Last Admin   acetaminophen (TYLENOL) tablet 650 mg  650 mg Oral Q6H PRN Rozetta Nunnery, NP       alum & mag hydroxide-simeth (MAALOX/MYLANTA) 200-200-20 MG/5ML suspension 30 mL  30 mL Oral Q4H PRN Lindon Romp A, NP       ARIPiprazole (ABILIFY) tablet 10 mg  10 mg Oral Daily Ethelene Hal, NP   10 mg at 04/11/21 0900   B-complex with vitamin C tablet 1 tablet  1 tablet Oral Daily Hill, Jackie Plum, MD   1 tablet at 04/11/21 0900   LORazepam (ATIVAN) tablet 0.5 mg  0.5 mg Oral Q4H PRN Maida Sale, MD       magnesium hydroxide (MILK OF MAGNESIA) suspension 30 mL  30 mL Oral Daily PRN Lindon Romp A, NP       melatonin tablet 5 mg  5 mg Oral QHS Hill, Jackie Plum, MD       temazepam (RESTORIL) capsule 7.5 mg  7.5 mg Oral QHS PRN Maida Sale, MD   7.5 mg at 04/10/21 2106   Vitamin D (Ergocalciferol) (DRISDOL) capsule 50,000 Units  50,000 Units Oral Q7 days Harlow Asa, MD   50,000 Units at 04/11/21 0900    Lab Results:  Results  for orders placed or performed during the hospital encounter of 04/06/21 (from the past 48 hour(s))  Ceruloplasmin     Status: None   Collection Time: 04/09/21  7:09 PM  Result Value Ref Range   Ceruloplasmin 18.4 16.0 - 31.0 mg/dL    Comment: (NOTE) Performed At: [facility] [address] [place], [place] [id number] Rush Farmer MD [id number]   HIV Antibody (routine testing w rflx)     Status: None   Collection Time: 04/09/21  7:09 PM  Result Value Ref Range   HIV Screen 4th Generation wRfx Non Reactive Non Reactive    Comment: Performed at [facility], [place] [address] [place], [place] [address]  Prolactin     Status: Abnormal   Collection Time: 04/09/21  7:09 PM  Result Value Ref Range   Prolactin 25.3 (H) 4.0 - 15.2 ng/mL    Comment: (NOTE) Performed At: [facility] [place], [place] [id number] Rush Farmer MD [id number]   RPR     Status: None   Collection Time: 04/09/21  7:09 PM  Result Value Ref Range   RPR Ser Ql NON REACTIVE NON REACTIVE    Comment: Performed at [facility], [address], [place], [place] [address]  Sedimentation rate     Status: Abnormal   Collection Time: 04/09/21  7:09 PM  Result Value Ref Range   Sed Rate 95 (H) 0 - 16 mm/hr    Comment: 04/09/21 @ 2205 VS Performed at [facility], [place] [address], [place], [place] [address]     Blood Alcohol level:  Lab Results  Component Value Date   ETH <10 29/92/4268    Metabolic Disorder Labs: Lab Results  Component Value Date   HGBA1C 5.7 (H) 04/05/2021   MPG 116.89 04/05/2021   Lab Results  Component Value Date   PROLACTIN 25.3 (H) 04/09/2021   PROLACTIN 21.7 (H) 04/08/2021   Lab Results  Component Value Date   CHOL 180 04/05/2021   TRIG 119 04/05/2021   HDL 59 04/05/2021   CHOLHDL 3.1 04/05/2021   VLDL 24 04/05/2021   Hayes 97 04/05/2021    Physical Findings: AIMS:  , ,  ,  ,    CIWA:    COWS:      Musculoskeletal: Strength & Muscle Tone: within normal limits Gait & Station: normal Patient leans: N/A  Psychiatric Specialty Exam:  Presentation  General Appearance: Appropriate for Environment; Casual; Fairly Groomed  Eye Contact:Fair  Speech:Clear and Coherent; Normal Rate  Speech Volume:Normal  Handedness:Right   Mood and Affect  Mood:Anxious; Depressed  Affect:Constricted; Depressed   Thought Process  Thought Processes:Disorganized  Descriptions of Associations:Tangential  Orientation:Full (Time, Place and Person)  Thought Content:Illogical; Paranoid Ideation; Tangential  History of Schizophrenia/Schizoaffective disorder:No  Duration of Psychotic Symptoms:Less than six months  Hallucinations:No data recorded  Ideas of Reference:Paranoia  Suicidal Thoughts:No data recorded  Homicidal Thoughts:No data recorded  Sensorium  Memory:Immediate Fair; Recent Fair; Remote Fair  Judgment:Poor  Insight:Lacking  Executive Functions  Concentration:Fair  Attention Span:Fair  [place]  Language:Good  Psychomotor Activity  Psychomotor Activity:No data recorded  Assets  Assets:Communication Skills; Desire for Improvement; Financial Resources/Insurance; Housing; Physical Health; Resilience; Social Support; Vocational/Educational  Sleep  Sleep:No data recorded  Physical Exam: Physical Exam Vitals and nursing note reviewed.  HENT:     Head: Normocephalic.  Pulmonary:     Effort: Pulmonary effort is normal.  Musculoskeletal:        General: Normal range of motion.     Cervical back: Normal range of motion.  Neurological:     General: No focal deficit present.     Mental Status: He is alert.  Psychiatric:        Mood and Affect: Mood is anxious.        Thought Content: Thought content is paranoid and delusional.        Judgment: Judgment is impulsive.   Review of Systems  Constitutional: Negative.  Negative for  fever.  HENT: Negative.  Negative for congestion, sinus pain and sore throat.   Respiratory: Negative.  Negative for cough and shortness of breath.   Cardiovascular: Negative.  Negative for chest pain.  Gastrointestinal: Negative.   Genitourinary: Negative.   Musculoskeletal: Negative.   Neurological: Negative.   Psychiatric/Behavioral:  Positive for depression. The patient is nervous/anxious.    Blood pressure 102/84, pulse (!) 103, temperature 97.8 F (36.6 C), temperature source Oral, resp. rate 18, height 6\' 1"  (1.854 m), weight 78.5 kg, SpO2 100 %. Body mass index is 22.82 kg/m.   Treatment Plan Summary: Daily contact with patient to assess and evaluate symptoms and progress in treatment and Medication management  Labs ordered, not yet drawn: ANA, Heavy metals  Labs results reviewed from 10/17: Ceruloplasmin 18.4 WNL, Sed Rate 95, Prolactin 25.3, RPR non-reactive, HIV screen non reactive.  EKG with NSR and QTc 434.   MDD, single episode, severe with psychotic features:  Patient received Abilify Maintena 400 mg IM on 10/12 -Abilify 10 mg daily  -discontinue Effexor - patient refusing  Anxiety/Sleep: -stop clonazepam - patient inconsistent/refusing. May be contributing to complaints of confusion and sedation.  -discontinue vistaril - anticholinergic can contribute to confusion in elderly -Start melatonin 5mg  pO QHS -Start restoril 7.5mg  PO QHS PRN Insomnia -stop trazodone due to lack of efficacy  Nutrition: Ergocalciferol 50 000 units PO weekly for vitamin D deficienct Stress B vitamin for B12 (and likely other Bs) deficiency  Other PRN's: Milk of Magnesia, Mylanta, Tylenol  Patient moved to his own room (10/16) with No Roommate order due to extreme paranoid delusions Continue every 15 minute safety checks Encourage participation in the therapeutic milieu Discharge planning in progress  Ethelene Hal, NP 04/11/2021, 12:44 PM

## 2021-04-11 NOTE — Group Note (Unsigned)
Group Topic: Goal Setting  Group Date: 04/11/2021 Start Time: 0900 End Time: 1000 Facilitators: Jacqlyn Larsen, NT  Department: [place] ADULT 300B  Number of Participants: 10  Group Focus: daily focus Treatment Modality:  Psychoeducation Interventions utilized were orientation and reality testing Purpose: regain self-worth   Name: Joshua Lloyd Date of Birth: June 05, 1961  MR: [id number]    Level of Participation: {THERAPIES; PSYCH GROUP PARTICIPATION GSUPJ:03159} Quality of Participation: {THERAPIES; PSYCH QUALITY OF PARTICIPATION:23992} Interactions with others: {THERAPIES; PSYCH INTERACTIONS:23993} Mood/Affect: {THERAPIES; PSYCH MOOD/AFFECT:23994} Triggers (if applicable): *** Cognition: {THERAPIES; PSYCH COGNITION:23995} Progress: {THERAPIES; PSYCH PROGRESS:23997} Response: *** Plan: {THERAPIES; PSYCH YVOP:92924}  Patients Problems:  Patient Active Problem List   Diagnosis Date Noted   B12 deficiency 04/10/2021   Vitamin D deficiency 04/10/2021   MDD (major depressive disorder), single episode, severe with psychosis ([place]) 04/07/2021   Anxiety disorder, unspecified 04/07/2021   Psychosis ([place])    Prostate cancer ([place]) 10/17/2016

## 2021-04-11 NOTE — BHH Group Notes (Signed)
[facility] Group Notes:  (Nursing/MHT/Case Management/Adjunct)  Date:  04/11/2021  Time:  8:18 PM  Type of Therapy:   NA Group  Participation Level:  Active  Participation Quality:  Appropriate and Attentive  Affect:  Appropriate  Cognitive:  Appropriate  Insight:  Appropriate, Good, and Improving  Engagement in Group:  Developing/Improving  Modes of Intervention:  Discussion  Summary of Progress/Problems:  pt is calm and was active and attentive in NA.  Maxine Glenn 04/11/2021, 8:18 PM

## 2021-04-11 NOTE — Progress Notes (Signed)
Patient ID: Joshua Lloyd, male   DOB: Sep 27, 1960, 60 y.o.   MRN: [id number]  Patient's family was contacted by this provider and Dr Berdine Addison, per the family's request for an update. Patient's family was aprised on medication changes and any improvement that patient has made in regards to his  anxiety, depression and paranoia. Patient's family was also counseled regarding neuro-psych testing for cognitive issues which may be underlying. SW is working to set up appointments for neuro psychiatric testing after discharge and that we are waiting for a call back with these appointments. Patient's family was informed that patient may be ready for discharge as soon as this weekend. Patient's family was also made aware that patient has vitamin levels that are very low, which is often seen in people who have not been eating right for a long period of time. Patient is currently taking a B-complex vitamin and weekly Vitamin D to boost these levels. Dr Berdine Addison discussed the results of patient's CT scan with the family and what appears to be volume loss greater than would be expected for a person of his age. She also discussed the results of his MOCA screen and explained his score, 25/30, is definitely indicative of someone who is having cognitive issues. Patient's family is trying to deal with this news as best they can and are willing to follow up after discharge to get him the testing he needs for a more definitive diagnosis. Patient's family is in agreement that they want patient to be as well as he can be before he comes home. Patient's family was reassured that he is safe and seems to be sleeping better, he also appears to be eating his meals. Dr Berdine Addison did stress strongly that patient should not be driving at this time. Patient's family is able to call for updates when needed and they have been visiting him daily.

## 2021-04-11 NOTE — Progress Notes (Signed)
D:  Patient's self inventory sheet, patient has poor sleep, no sleep medication.  Fair appetite, low energy level, poor concentration.  Rated depression, hopeless and anxiety 1.  Denied withdrawals.  Denied SI.  Physical problems, lightheaded.  Denied physical pain.  Goal is stay positive.  When can he discharge?   A:  Medications administered per MD orders.  Emotional support and encouragement given patient. R:  Denied SI and HI, contracts for safety.  Denied A/V hallucinations.  Safety maintained with 15 minute checks.

## 2021-04-11 NOTE — BHH Group Notes (Signed)
The focus of this group is to help patients establish daily goals to achieve during treatment and discuss how the patient can incorporate goal setting into their daily lives to aide in recovery.  Pt attended morning goals group. He stated that staying positive was his goal for today.

## 2021-04-11 NOTE — Progress Notes (Signed)
Pt awake in his room. Pt asked if he wanted something to relax him and help him return to sleep. Pt refused any PRN medication.

## 2021-04-11 NOTE — Group Note (Signed)
[facility] LCSW Group Therapy Note   Group Date: 04/11/2021 Start Time: 1300 End Time: 1400   Type of Therapy/Topic:  Group Therapy:  Emotion Regulation  Participation Level:  Minimal   Mood:  Description of Group:    The purpose of this group is to assist patients in learning to regulate negative emotions and experience positive emotions. Patients will be guided to discuss ways in which they have been vulnerable to their negative emotions. These vulnerabilities will be juxtaposed with experiences of positive emotions or situations, and patients challenged to use positive emotions to combat negative ones. Special emphasis will be placed on coping with negative emotions in conflict situations, and patients will process healthy conflict resolution skills.  Therapeutic Goals: Patient will identify two positive emotions or experiences to reflect on in order to balance out negative emotions:  Patient will label two or more emotions that they find the most difficult to experience:  Patient will be able to demonstrate positive conflict resolution skills through discussion or role plays:   Summary of Patient Progress:  Pt stated that they are feeling tired today.  The Pt accepted the handout of the emotion wheel and discussed various emotions about discharge and being in the hospital.  Pt was appropriate and participated in open discussion with their peers.     Therapeutic Modalities:   Cognitive Behavioral Therapy Feelings Identification Dialectical Behavioral Therapy   Darleen Crocker, LCSWA

## 2021-04-11 NOTE — Progress Notes (Signed)
DAR Note: Patient guarded, suspicious, paranoid, however, denies SI/HI/AVH. Patient would not let this RN give him some water for his medications, but went to his room and retrieved a cup of water that he had saved and brought it back to the med window, and used that water to take his meds, instead of the one that was offered to him at med window. Pt denies any current concerns, Q15 minute safety checks in place.   04/11/21 2321  Psych Admission Type (Psych Patients Only)  Admission Status Involuntary  Psychosocial Assessment  Patient Complaints None  Eye Contact Fair  Facial Expression Anxious;Worried  Affect Anxious;Preoccupied  Speech Logical/coherent;Pressured  Interaction Cautious  Motor Activity Other (Comment) ([place])  Appearance/Hygiene Unremarkable  Behavior Characteristics Cooperative;Guarded  Mood Suspicious  Thought Process  Coherency Concrete thinking  Content Paranoia  Delusions None reported or observed  Perception WDL  Hallucination None reported or observed  Judgment Impaired  Confusion None  Danger to Self  Current suicidal ideation? Denies  Danger to Others  Danger to Others None reported or observed

## 2021-04-11 NOTE — BH Assessment (Signed)
Care Management - Follow Up [facility] Discharges   Writer attempted to make contact with patient today and was unsuccessful.  Writer left a HIPPA compliant voice message.   Per chart review, patient was placed inpatient at [facility] on 04-06-21.

## 2021-04-12 NOTE — Group Note (Signed)
Group Topic: Goal Setting  Group Date: 04/12/2021 Start Time: 0900 End Time: 0930 Facilitators: Lorilei Horan, Tyrell Antonio, NT  Department: [place] ADULT 300B  Number of Participants: 5  Group Focus: goals/reality orientation Treatment Modality:  Psychoeducation Interventions utilized were orientation Purpose: reinforce self-care  Name: Joshua Lloyd Date of Birth: 1961/05/24  MR: [id number]    Level of Participation: Pt has a goal of staying positive and to be with people.

## 2021-04-12 NOTE — Progress Notes (Signed)
[facility] Group Notes:  (Nursing/MHT/Case Management/Adjunct)  Date:  04/12/2021  Time:  2015  Type of Therapy:   wrap up group  Participation Level:  Active  Participation Quality:  Appropriate, Attentive, Sharing, and Supportive  Affect:  Anxious, Blunted, and Flat  Cognitive:  Confused  Insight:  Improving  Engagement in Group:  Engaged  Modes of Intervention:  Clarification, Education, and Support  Summary of Progress/Problems: Positive thinking and positive change were discussed.   Winfield Rast S 04/12/2021, 9:09 PM

## 2021-04-12 NOTE — Progress Notes (Signed)
   04/12/21 0618  Vital Signs  Pulse Rate 93  Pulse Rate Source Monitor  BP 104/85  BP Location Left Arm  BP Method Automatic  Patient Position (if appropriate) Standing  Oxygen Therapy  SpO2 97 %   D: Patient denies SI/HI/AVH. Patient denies both anxiety and depression.  A:  Patient took scheduled medicine.  Support and encouragement provided Routine safety checks conducted every 15 minutes. Patient  Informed to notify staff with any concerns.  R:Safety maintained.

## 2021-04-12 NOTE — Progress Notes (Signed)
CSW spoke with patient wife, [name].  Social worker allowed wife to ask any questions about patient care that she needed.  Wife was appreciative of Social worker time and ability to assist process patient potential diagnosis.  Wife requested if someone from treatment team can meet with her, her son and patient upon discharge to go over follow up to ensure they understand next steps to support patient.  CSW agreed to pass note along with request.  Wife also reports that son is coming into town to assist with support and they will need prior notice prior to patient discharge.    Ulysses Alper, LCSW, [place] Social Worker  [facility]

## 2021-04-12 NOTE — BHH Group Notes (Signed)
Pt attended crisis management group.

## 2021-04-12 NOTE — BHH Group Notes (Signed)
Adult Psychoeducational Group Note  Date:  04/12/2021 Time:  2:53 PM  Group Topic/Focus:  Wellness Toolbox:   The focus of this group is to discuss various aspects of wellness, balancing those aspects and exploring ways to increase the ability to experience wellness.  Patients will create a wellness toolbox for use upon discharge.  Participation Level:  Active  Participation Quality:  Appropriate  Affect:  Appropriate  Cognitive:  Alert  Insight: Good  Engagement in Group:  Engaged  Modes of Intervention:  Activity  Additional Comments:  Patient attended and participated in the relaxation group activity.  Annie Sable 04/12/2021, 2:53 PM

## 2021-04-13 ENCOUNTER — Encounter (HOSPITAL_COMMUNITY): Payer: Self-pay

## 2021-04-13 DIAGNOSIS — F323 Major depressive disorder, single episode, severe with psychotic features: Principal | ICD-10-CM

## 2021-04-13 NOTE — Plan of Care (Signed)
  Problem: Health Behavior/Discharge Planning: Goal: Identification of resources available to assist in meeting health care needs will improve Outcome: Progressing   Problem: Coping: Goal: Ability to identify and develop effective coping behavior will improve Outcome: Progressing Goal: Ability to use eye contact when communicating with others will improve Outcome: Progressing

## 2021-04-13 NOTE — Progress Notes (Signed)
The focus of this group is to help patients establish daily goals to achieve during treatment and discuss how the patient can incorporate goal setting into their daily lives to aide in recovery.Adult Psychoeducational Group Note  Date:  04/13/2021 Time:  10:05 AM  Group Topic/Focus:  Goals Group:   The focus of this group is to help patients establish daily goals to achieve during treatment and discuss how the patient can incorporate goal setting into their daily lives to aide in recovery.  Participation Level:  Active  Participation Quality:  Appropriate  Affect:  Appropriate  Cognitive:  Alert  Insight: Appropriate  Engagement in Group:  Engaged  Modes of Intervention:  Discussion  Additional Comments:  Pt attended goals group and participated in discussion.  Raygen Linquist R Virdia Ziesmer 04/13/2021, 10:05 AM

## 2021-04-13 NOTE — BH IP Treatment Plan (Signed)
Interdisciplinary Treatment and Diagnostic Plan Update  04/13/2021 Time of Session:  Joshua Lloyd MRN: [id number]  Principal Diagnosis: MDD (major depressive disorder), single episode, severe with psychosis ([place])  Secondary Diagnoses: Principal Problem:   MDD (major depressive disorder), single episode, severe with psychosis ([place]) Active Problems:   Anxiety disorder, unspecified   B12 deficiency   Vitamin D deficiency   Cognitive and behavioral changes   Current Medications:  Current Facility-Administered Medications  Medication Dose Route Frequency Provider Last Rate Last Admin   acetaminophen (TYLENOL) tablet 650 mg  650 mg Oral Q6H PRN Rozetta Nunnery, NP       alum & mag hydroxide-simeth (MAALOX/MYLANTA) 200-200-20 MG/5ML suspension 30 mL  30 mL Oral Q4H PRN Lindon Romp A, NP       ARIPiprazole (ABILIFY) tablet 10 mg  10 mg Oral Daily Ethelene Hal, NP   10 mg at 04/13/21 0800   B-complex with vitamin C tablet 1 tablet  1 tablet Oral Daily Hill, Jackie Plum, MD   1 tablet at 04/13/21 0800   LORazepam (ATIVAN) tablet 0.5 mg  0.5 mg Oral Q4H PRN Maida Sale, MD       magnesium hydroxide (MILK OF MAGNESIA) suspension 30 mL  30 mL Oral Daily PRN Lindon Romp A, NP       melatonin tablet 5 mg  5 mg Oral QHS Hill, Jackie Plum, MD   5 mg at 04/12/21 2118   mirtazapine (REMERON) tablet 15 mg  15 mg Oral QHS Hill, Jackie Plum, MD   15 mg at 04/12/21 2118   temazepam (RESTORIL) capsule 15 mg  15 mg Oral QHS PRN Maida Sale, MD       Vitamin D (Ergocalciferol) (DRISDOL) capsule 50,000 Units  50,000 Units Oral Q7 days Harlow Asa, MD   50,000 Units at 04/11/21 0900   PTA Medications: Medications Prior to Admission  Medication Sig Dispense Refill Last Dose   clonazePAM (KLONOPIN) 1 MG tablet Take 0.5-1 mg by mouth at bedtime.      mirtazapine (REMERON) 30 MG tablet Take 30 mg by mouth at bedtime.       Patient Stressors: Financial  difficulties   Occupational concerns    Patient Strengths: Capable of independent living  Engineer, drilling fund of knowledge  Physical Health  Supportive family/friends   Treatment Modalities: Medication Management, Group therapy, Case management,  1 to 1 session with clinician, Psychoeducation, Recreational therapy.   Physician Treatment Plan for Primary Diagnosis: MDD (major depressive disorder), single episode, severe with psychosis ([place]) Long Term Goal(s): Improvement in symptoms so as ready for discharge   Short Term Goals: Ability to identify changes in lifestyle to reduce recurrence of condition will improve Ability to verbalize feelings will improve Ability to disclose and discuss suicidal ideas Ability to identify and develop effective coping behaviors will improve Compliance with prescribed medications will improve Ability to identify triggers associated with substance abuse/mental health issues will improve  Medication Management: Evaluate patient's response, side effects, and tolerance of medication regimen.  Therapeutic Interventions: 1 to 1 sessions, Unit Group sessions and Medication administration.  Evaluation of Outcomes: Progressing  Physician Treatment Plan for Secondary Diagnosis: Principal Problem:   MDD (major depressive disorder), single episode, severe with psychosis ([place]) Active Problems:   Anxiety disorder, unspecified   B12 deficiency   Vitamin D deficiency   Cognitive and behavioral changes  Long Term Goal(s): Improvement in symptoms so as ready for discharge  Short Term Goals: Ability to identify changes in lifestyle to reduce recurrence of condition will improve Ability to verbalize feelings will improve Ability to disclose and discuss suicidal ideas Ability to identify and develop effective coping behaviors will improve Compliance with prescribed medications will improve Ability to identify triggers associated  with substance abuse/mental health issues will improve     Medication Management: Evaluate patient's response, side effects, and tolerance of medication regimen.  Therapeutic Interventions: 1 to 1 sessions, Unit Group sessions and Medication administration.  Evaluation of Outcomes: Progressing   RN Treatment Plan for Primary Diagnosis: MDD (major depressive disorder), single episode, severe with psychosis ([place]) Long Term Goal(s): Knowledge of disease and therapeutic regimen to maintain health will improve  Short Term Goals: Ability to verbalize feelings will improve, Ability to identify and develop effective coping behaviors will improve, and Compliance with prescribed medications will improve  Medication Management: RN will administer medications as ordered by provider, will assess and evaluate patient's response and provide education to patient for prescribed medication. RN will report any adverse and/or side effects to prescribing provider.  Therapeutic Interventions: 1 on 1 counseling sessions, Psychoeducation, Medication administration, Evaluate responses to treatment, Monitor vital signs and CBGs as ordered, Perform/monitor CIWA, COWS, AIMS and Fall Risk screenings as ordered, Perform wound care treatments as ordered.  Evaluation of Outcomes: Progressing   LCSW Treatment Plan for Primary Diagnosis: MDD (major depressive disorder), single episode, severe with psychosis ([place]) Long Term Goal(s): Safe transition to appropriate next level of care at discharge, Engage patient in therapeutic group addressing interpersonal concerns.  Short Term Goals: Engage patient in aftercare planning with referrals and resources, Increase social support, and Increase ability to appropriately verbalize feelings  Therapeutic Interventions: Assess for all discharge needs, 1 to 1 time with Social worker, Explore available resources and support systems, Assess for adequacy in community support network, Educate  family and significant other(s) on suicide prevention, Complete Psychosocial Assessment, Interpersonal group therapy.  Evaluation of Outcomes: Progressing   Progress in Treatment: Attending groups: Yes. Participating in groups: Yes. Taking medication as prescribed: Yes. Toleration medication: Yes. Family/Significant other contact made: Yes, individual(s) contacted:  wife Patient understands diagnosis: No. Discussing patient identified problems/goals with staff: Yes. Medical problems stabilized or resolved: Yes. Denies suicidal/homicidal ideation: Yes. Issues/concerns per patient self-inventory: No. Other: None  New problem(s) identified: No, Describe:  None  New Short Term/Long Term Goal(s):medication stabilization, elimination of SI thoughts, development of comprehensive mental wellness plan.   Patient Goals:  "Be more positive, be more engaging, ger over remorseful thinking."   Discharge Plan or Barriers: Pt will follow up for therapy at [place] [facility]. Pt will follow up for therapy at [place]. Pt has been referred and recommended to follow up for neurocognitive testing at [facility].   Reason for Continuation of Hospitalization: Medication stabilization  Estimated Length of Stay: 3-5 days   Scribe for Treatment Team: Eliott Nine 04/13/2021 10:41 AM

## 2021-04-13 NOTE — [facility] Group Notes (Signed)
Adult Psychoeducational Group Note  Date:  04/13/2021 Time:  6:14 PM  Group Topic/Focus:  Coping With Mental Health Crisis:   The purpose of this group is to help patients identify strategies for coping with mental health crisis.  Group discusses possible causes of crisis and ways to manage them effectively.  Participation Level:  Active  Participation Quality:  Appropriate  Affect:  Appropriate  Cognitive:  Appropriate  Insight: Appropriate  Engagement in Group:  Supportive  Modes of Intervention:  Discussion  Additional Comments:    Deeann Dowse 04/13/2021, 6:14 PM

## 2021-04-13 NOTE — Progress Notes (Signed)
  Pt BP 126/100 at 1600.    04/12/21 2044  Vital Signs  Pulse Rate 78  BP (!) 134/92  BP Location Right Arm  BP Method Automatic  Patient Position (if appropriate) Sitting     04/12/21 2045  Vital Signs  Pulse Rate 91  BP (!) 125/93  BP Location Right Arm  BP Method Automatic  Patient Position (if appropriate) Standing    Diastolic BP is decreasing. Hydrated and medicated pt with scheduled Melatonin and Remeron per MAR.

## 2021-04-13 NOTE — Progress Notes (Signed)
[facility] MD Progress Note  04/13/2021 5:28 PM Joshua Lloyd  MRN:  [id number]  Subjective: Joshua Lloyd reports, "I'm doing well on the medicines, no side effects. My issues here is, I can't sleep at night because I'm sleeping on a metal bed & a hard mattress. The food here sucks, that is the reason I don't have an appetite. I have been group sessions, it has been helpful".  Objective: Joshua Lloyd is a 60 year old male who presented , voluntarily to [facility] on 10/13 for a walk-in assessment. He was  accompanied by his wife, Evie. He was placed under IVC by the provider at [facility]. He was having difficulty at work, which started about 12 weeks ago, he had been under an immense amount of stress, which caused him to become paranoid and suspicious. He was admitted to [facility] for medication management and stabilization.   Per previous notes, no changes.: Evaluation on the unit today, 10/20: Patient was seen, chart reviewed and case discussed with the treatment team.  The patient is less paranoid today than previous. He has limited insight into illness, but is showing some improvements in anxiety and depression. He is able to discuss discharge planning. Advised not to drive. Discussed need for neuropsychiatric testing again. He continues to have reservations due to denial of deficits. He plans to return to work. He plans to return to home as scheduled this weekend. Denies thoughts of harm to self or others. Denies hallucinations. Continues to ruminated about others persecuting him through denial of privileges or access to certain hygiene items, but it is far less apparent. No reports of side effects from medications.   Principal Problem: MDD (major depressive disorder), single episode, severe with psychosis ([place])  Diagnosis: Principal Problem:   MDD (major depressive disorder), single episode, severe with psychosis ([place]) Active Problems:   Anxiety disorder, unspecified   B12 deficiency   Vitamin D deficiency    Cognitive and behavioral changes  Total Time spent with patient: 15 minutes  Past Psychiatric History: None prior to this hospitalization  Past Medical History:  Past Medical History:  Diagnosis Date   Cancer ([place])    prostate    Past Surgical History:  Procedure Laterality Date   NO PAST SURGERIES     ROBOT ASSISTED LAPAROSCOPIC RADICAL PROSTATECTOMY N/A 10/17/2016   Procedure: ROBOTIC ASSISTED LAPAROSCOPIC RADICAL PROSTATECTOMY LEVEL 2/ BILATERAL PELVIC LYMPHADENECTOMY;  Surgeon: Raynelle Bring, MD;  Location: [facility] ORS;  Service: Urology;  Laterality: N/A;   WRIST SURGERY     skiing accident when 60 years old   Family History: History reviewed. No pertinent family history. Family Psychiatric  History: Maternal GF with Alzheimers in his 74's Social History:  Social History   Substance and Sexual Activity  Alcohol Use Yes   Alcohol/week: 1.0 standard drink   Types: 1 Glasses of wine per week   Comment: ocassionally     Social History   Substance and Sexual Activity  Drug Use No    Social History   Socioeconomic History   Marital status: Married    Spouse name: Not on file   Number of children: Not on file   Years of education: Not on file   Highest education level: Not on file  Occupational History   Not on file  Tobacco Use   Smoking status: Never   Smokeless tobacco: Never  Substance and Sexual Activity   Alcohol use: Yes    Alcohol/week: 1.0 standard drink    Types: 1 Glasses of  wine per week    Comment: ocassionally   Drug use: No   Sexual activity: Yes  Other Topics Concern   Not on file  Social History Narrative   Not on file   Social Determinants of Health   Financial Resource Strain: Not on file  Food Insecurity: Not on file  Transportation Needs: Not on file  Physical Activity: Not on file  Stress: Not on file  Social Connections: Not on file   Additional Social History:   Sleep: Poor  Appetite:  Fair  Current Medications: Current  Facility-Administered Medications  Medication Dose Route Frequency Provider Last Rate Last Admin   acetaminophen (TYLENOL) tablet 650 mg  650 mg Oral Q6H PRN Rozetta Nunnery, NP       alum & mag hydroxide-simeth (MAALOX/MYLANTA) 200-200-20 MG/5ML suspension 30 mL  30 mL Oral Q4H PRN Lindon Romp A, NP       ARIPiprazole (ABILIFY) tablet 10 mg  10 mg Oral Daily Ethelene Hal, NP   10 mg at 04/13/21 0800   B-complex with vitamin C tablet 1 tablet  1 tablet Oral Daily Hill, Jackie Plum, MD   1 tablet at 04/13/21 0800   LORazepam (ATIVAN) tablet 0.5 mg  0.5 mg Oral Q4H PRN Maida Sale, MD       magnesium hydroxide (MILK OF MAGNESIA) suspension 30 mL  30 mL Oral Daily PRN Lindon Romp A, NP       melatonin tablet 5 mg  5 mg Oral QHS Hill, Jackie Plum, MD   5 mg at 04/12/21 2118   mirtazapine (REMERON) tablet 15 mg  15 mg Oral QHS Hill, Jackie Plum, MD   15 mg at 04/12/21 2118   temazepam (RESTORIL) capsule 15 mg  15 mg Oral QHS PRN Maida Sale, MD       Vitamin D (Ergocalciferol) (DRISDOL) capsule 50,000 Units  50,000 Units Oral Q7 days Harlow Asa, MD   50,000 Units at 04/11/21 0900   Lab Results:  No results found for this or any previous visit (from the past 48 hour(s)).  Blood Alcohol level:  Lab Results  Component Value Date   ETH <10 95/62/1308   Metabolic Disorder Labs: Lab Results  Component Value Date   HGBA1C 5.7 (H) 04/05/2021   MPG 116.89 04/05/2021   Lab Results  Component Value Date   PROLACTIN 25.3 (H) 04/09/2021   PROLACTIN 21.7 (H) 04/08/2021   Lab Results  Component Value Date   CHOL 180 04/05/2021   TRIG 119 04/05/2021   HDL 59 04/05/2021   CHOLHDL 3.1 04/05/2021   VLDL 24 04/05/2021   [place] 97 04/05/2021   Physical Findings: AIMS:  , ,  ,  ,    CIWA:    COWS:     Musculoskeletal: Strength & Muscle Tone: within normal limits Gait & Station: normal Patient leans: N/A  Psychiatric Specialty  Exam:  Presentation  General Appearance: Appropriate for Environment; Casual; Fairly Groomed  Eye Contact:Fair  Speech:Clear and Coherent; Normal Rate  Speech Volume:Normal  Handedness:Right  Mood and Affect  Mood:Anxious; Depressed  Affect:Constricted; Depressed  Thought Process  Thought Processes:Disorganized  Descriptions of Associations:Tangential  Orientation:Full (Time, Place and Person)  Thought Content:Illogical; Paranoid Ideation; Tangential  History of Schizophrenia/Schizoaffective disorder:No  Duration of Psychotic Symptoms:Less than six months  Hallucinations:No data recorded  Ideas of Reference:Paranoia  Suicidal Thoughts:No data recorded  Homicidal Thoughts:No data recorded  Sensorium  Memory:Immediate Fair; Recent Fair; Remote Fair  Judgment:Poor  Insight:Lacking  Executive Functions  Concentration:Fair  Attention Span:Fair  [place]  Language:Good  Psychomotor Activity  Psychomotor Activity:No data recorded  Assets  Assets:Communication Skills; Desire for Improvement; Financial Resources/Insurance; Housing; Physical Health; Resilience; Social Support; Vocational/Educational  Sleep  Sleep: Poor Sleep: 3.0  Physical Exam: Physical Exam Vitals and nursing note reviewed.  HENT:     Head: Normocephalic.  Pulmonary:     Effort: Pulmonary effort is normal.  Musculoskeletal:        General: Normal range of motion.     Cervical back: Normal range of motion.  Neurological:     General: No focal deficit present.     Mental Status: He is alert.  Psychiatric:        Mood and Affect: Mood is anxious.        Thought Content: Thought content is paranoid and delusional.        Judgment: Judgment is impulsive.   Review of Systems  Constitutional: Negative.  Negative for fever.  HENT: Negative.  Negative for congestion, sinus pain and sore throat.   Respiratory: Negative.  Negative for cough and shortness  of breath.   Cardiovascular: Negative.  Negative for chest pain.  Gastrointestinal: Negative.   Genitourinary: Negative.   Musculoskeletal: Negative.   Neurological: Negative.   Psychiatric/Behavioral:  Positive for depression. Negative for hallucinations, substance abuse and suicidal ideas. The patient is nervous/anxious.    Blood pressure 112/81, pulse 91, temperature (!) 97.5 F (36.4 C), temperature source Oral, resp. rate 16, height 6\' 1"  (1.854 m), weight 78.5 kg, SpO2 98 %. Body mass index is 22.82 kg/m.  Treatment Plan Summary: Daily contact with patient to assess and evaluate symptoms and progress in treatment and Medication management.  Continue inpatient hospitalization. Will continue today 04/13/2021 plan as below except where it is noted.   MDD, single episode, severe with psychotic features:  Patient received Abilify Maintena 400 mg IM on 10/12 -Abilify 10 mg daily  -discontinued Effexor - patient refused. -Neuropsych testing ordered, as there is concern that this episode triggered by inability to keep up at work secondary to underlying cognitive deficits. MoCA was 25/30 (patient with college degree)  Anxiety/Sleep: -stop clonazepam - patient inconsistent/refusing. May be contributing to complaints of confusion and sedation.  -discontinue vistaril - anticholinergic can contribute to confusion in elderly -melatonin 5mg  pO QHS -restoril 15mg  PO QHS PRN Insomnia -Remeron 15mg  PO QHS for mood, anxiety, sleep and appetite -Ativan available PRN anxiety, and will have some tablets to go home with at discharge  Nutrition: Ergocalciferol 50 000 units PO weekly for vitamin D deficienct Stress B vitamin for B12 (and likely other Bs) deficiency  Other PRN's: Milk of Magnesia, Mylanta, Tylenol  Continue every 15 minute safety checks Encourage participation in the therapeutic milieu Discharge to home saturday  Lindell Spar, NP, pmhnp, fnp-bc 04/13/2021, 5:28 PM Patient ID:  Alfonzo Feller, male   DOB: Apr 08, 1961, 60 y.o.   MRN: [id number]

## 2021-04-13 NOTE — Group Note (Signed)
Recreation Therapy Group Note   Group Topic:Stress Management  Group Date: 04/13/2021 Start Time: 0930 End Time: 0945 Facilitators: Victorino Sparrow, LRT/CTRS Location: [address] Dayroom  Goal Area(s) Addresses:  Patient will actively participate in stress management techniques presented during session.  Patient will successfully identify benefit of practicing stress management post d/c.   Group Description: Meditation.  LRT played a meditation that focused on looking at each day as a new beginning to try new things, make a new friend, take time for yourself or make peace with any individual you may have an issue with.  Patients were to listen and follow along as meditation played to engage in activity.   Affect/Mood: Appropriate   Participation Level: Engaged   Participation Quality: Independent   Behavior: Appropriate   Speech/Thought Process: Focused   Insight: Good   Judgement: Good   Modes of Intervention: Meditation   Patient Response to Interventions:  Engaged   Education Outcome:  Acknowledges education and In group clarification offered    Clinical Observations/Individualized Feedback: Pt attended and participated in group.    Plan: Continue to engage patient in RT group sessions 2-3x/week.   Victorino Sparrow, LRT/CTRS 04/13/2021 11:52 AM

## 2021-04-13 NOTE — Progress Notes (Signed)
  Pt presents with no complaints:  Pt only reports that, "the bed is hard."  Pt appeared with elevated paranoia and anxiety.  Pt states, "going to shut my door tonight and try to get some sleep."  Pt denies SI/HI, and verbally contracts for safety.  Pt denies AVH.  Pt remains safe on unit with Q 15 minute safety checks.     04/12/21 2110  Psych Admission Type (Psych Patients Only)  Admission Status Involuntary  Psychosocial Assessment  Patient Complaints Other (Comment) (Pt states, "the bed is hard")  Eye Contact Fair  Facial Expression Anxious;Worried  Affect Anxious;Preoccupied  Speech Logical/coherent;Pressured  Interaction Cautious  Motor Activity Slow  Appearance/Hygiene Unremarkable  Behavior Characteristics Cooperative;Guarded  Mood Anxious;Suspicious  Thought Process  Coherency Concrete thinking  Content Paranoia  Delusions None reported or observed  Perception WDL  Hallucination None reported or observed  Judgment Impaired  Confusion None  Danger to Self  Current suicidal ideation? Denies  Danger to Others  Danger to Others None reported or observed

## 2021-04-13 NOTE — Progress Notes (Addendum)
[facility] MD Progress Note Late entry for 04/12/21  04/13/2021 10:56 AM PRITESH SOBECKI  MRN:  [id number]  Subjective:  "I really don't know why I'm still here"   Objective: Fuquan Wilson is a 60 year old male who presented , voluntarily to [facility] on 10/13 for a walk-in assessment. He was  accompanied by his wife, Evie. He was placed under IVC by the provider at [facility]. He was having difficulty at work, which started about 12 weeks ago, he had been under an immense amount of stress, which caused him to become paranoid and suspicious. He was admitted to [facility] for medication management and stabilization.   Evaluation on the unit today, 10/20: Patient was seen, chart reviewed and case discussed with the treatment team.  The patient is less paranoid today than previous. He has limited insight into illness, but is showing some improvements in anxiety and depression. He is able to discuss discharge planning. Advised not to drive. Discussed need for neuropsychiatric testing again. He continues to have reservations due to denial of deficits. He plans to return to work. He plans to return to home as scheduled this weekend. Denies thoughts of harm to self or others. Denies hallucinations. Continues to ruminated about others persecuting him through denial of privileges or access to certain hygiene items, but it is far less apparent. No reports of side effects from medications.   Principal Problem: MDD (major depressive disorder), single episode, severe with psychosis ([place]) Diagnosis: Principal Problem:   MDD (major depressive disorder), single episode, severe with psychosis ([place]) Active Problems:   Anxiety disorder, unspecified   B12 deficiency   Vitamin D deficiency   Cognitive and behavioral changes  Total Time spent with patient:  25 minutes  Past Psychiatric History: None prior to this hospitalization  Past Medical History:  Past Medical History:  Diagnosis Date   Cancer ([place])    prostate    Past  Surgical History:  Procedure Laterality Date   NO PAST SURGERIES     ROBOT ASSISTED LAPAROSCOPIC RADICAL PROSTATECTOMY N/A 10/17/2016   Procedure: ROBOTIC ASSISTED LAPAROSCOPIC RADICAL PROSTATECTOMY LEVEL 2/ BILATERAL PELVIC LYMPHADENECTOMY;  Surgeon: Raynelle Bring, MD;  Location: [facility];  Service: Urology;  Laterality: N/A;   WRIST SURGERY     skiing accident when 60 years old   Family History: History reviewed. No pertinent family history. Family Psychiatric  History: Maternal GF with Alzheimers in his 54's Social History:  Social History   Substance and Sexual Activity  Alcohol Use Yes   Alcohol/week: 1.0 standard drink   Types: 1 Glasses of wine per week   Comment: ocassionally     Social History   Substance and Sexual Activity  Drug Use No    Social History   Socioeconomic History   Marital status: Married    Spouse name: Not on file   Number of children: Not on file   Years of education: Not on file   Highest education level: Not on file  Occupational History   Not on file  Tobacco Use   Smoking status: Never   Smokeless tobacco: Never  Substance and Sexual Activity   Alcohol use: Yes    Alcohol/week: 1.0 standard drink    Types: 1 Glasses of wine per week    Comment: ocassionally   Drug use: No   Sexual activity: Yes  Other Topics Concern   Not on file  Social History Narrative   Not on file   Social Determinants of Health  Financial Resource Strain: Not on file  Food Insecurity: Not on file  Transportation Needs: Not on file  Physical Activity: Not on file  Stress: Not on file  Social Connections: Not on file   Additional Social History:      Sleep: Fair  Appetite:  Fair  Current Medications: Current Facility-Administered Medications  Medication Dose Route Frequency Provider Last Rate Last Admin   acetaminophen (TYLENOL) tablet 650 mg  650 mg Oral Q6H PRN Rozetta Nunnery, NP       alum & mag hydroxide-simeth (MAALOX/MYLANTA) 200-200-20  MG/5ML suspension 30 mL  30 mL Oral Q4H PRN Lindon Romp A, NP       ARIPiprazole (ABILIFY) tablet 10 mg  10 mg Oral Daily Ethelene Hal, NP   10 mg at 04/13/21 0800   B-complex with vitamin C tablet 1 tablet  1 tablet Oral Daily Emric Kowalewski, Jackie Plum, MD   1 tablet at 04/13/21 0800   LORazepam (ATIVAN) tablet 0.5 mg  0.5 mg Oral Q4H PRN Maida Sale, MD       magnesium hydroxide (MILK OF MAGNESIA) suspension 30 mL  30 mL Oral Daily PRN Lindon Romp A, NP       melatonin tablet 5 mg  5 mg Oral QHS Donnavan Covault, Jackie Plum, MD   5 mg at 04/12/21 2118   mirtazapine (REMERON) tablet 15 mg  15 mg Oral QHS Abdulmalik Darco, Jackie Plum, MD   15 mg at 04/12/21 2118   temazepam (RESTORIL) capsule 15 mg  15 mg Oral QHS PRN Maida Sale, MD       Vitamin D (Ergocalciferol) (DRISDOL) capsule 50,000 Units  50,000 Units Oral Q7 days Harlow Asa, MD   50,000 Units at 04/11/21 0900    Lab Results:  No results found for this or any previous visit (from the past 48 hour(s)).   Blood Alcohol level:  Lab Results  Component Value Date   ETH <10 35/70/1779    Metabolic Disorder Labs: Lab Results  Component Value Date   HGBA1C 5.7 (H) 04/05/2021   MPG 116.89 04/05/2021   Lab Results  Component Value Date   PROLACTIN 25.3 (H) 04/09/2021   PROLACTIN 21.7 (H) 04/08/2021   Lab Results  Component Value Date   CHOL 180 04/05/2021   TRIG 119 04/05/2021   HDL 59 04/05/2021   CHOLHDL 3.1 04/05/2021   VLDL 24 04/05/2021   [place] 97 04/05/2021    Physical Findings: AIMS:  , ,  ,  ,    CIWA:    COWS:     Musculoskeletal: Strength & Muscle Tone: within normal limits Gait & Station: normal Patient leans: N/A  Psychiatric Specialty Exam:  Presentation  General Appearance: Appropriate for Environment; Casual; Fairly Groomed  Eye Contact:Fair  Speech:Clear and Coherent; Normal Rate  Speech Volume:Normal  Handedness:Right   Mood and Affect  Mood:Anxious;  Depressed  Affect:Constricted; Depressed   Thought Process  Thought Processes:Disorganized  Descriptions of Associations:Tangential  Orientation:Full (Time, Place and Person)  Thought Content:Illogical; Paranoid Ideation; Tangential  History of Schizophrenia/Schizoaffective disorder:No  Duration of Psychotic Symptoms:Less than six months  Hallucinations:No data recorded  Ideas of Reference:Paranoia  Suicidal Thoughts:No data recorded  Homicidal Thoughts:No data recorded  Sensorium  Memory:Immediate Fair; Recent Fair; Remote Fair  Judgment:Poor  Insight:Lacking  Executive Functions  Concentration:Fair  Attention Span:Fair  [place]  Language:Good  Psychomotor Activity  Psychomotor Activity:No data recorded  Assets  Assets:Communication Skills; Desire for Improvement; Financial Resources/Insurance;  Housing; Physical Health; Resilience; Social Support; Vocational/Educational  Sleep  Sleep:No data recorded  Physical Exam: Physical Exam Vitals and nursing note reviewed.  HENT:     Head: Normocephalic.  Pulmonary:     Effort: Pulmonary effort is normal.  Musculoskeletal:        General: Normal range of motion.     Cervical back: Normal range of motion.  Neurological:     General: No focal deficit present.     Mental Status: He is alert.  Psychiatric:        Mood and Affect: Mood is anxious.        Thought Content: Thought content is paranoid and delusional.        Judgment: Judgment is impulsive.   Review of Systems  Constitutional: Negative.  Negative for fever.  HENT: Negative.  Negative for congestion, sinus pain and sore throat.   Respiratory: Negative.  Negative for cough and shortness of breath.   Cardiovascular: Negative.  Negative for chest pain.  Gastrointestinal: Negative.   Genitourinary: Negative.   Musculoskeletal: Negative.   Neurological: Negative.   Psychiatric/Behavioral:  Positive for depression.  Negative for hallucinations, substance abuse and suicidal ideas. The patient is nervous/anxious.    Blood pressure 114/78, pulse (!) 106, temperature 98 F (36.7 C), resp. rate 16, height 6\' 1"  (1.854 m), weight 78.5 kg, SpO2 98 %. Body mass index is 22.82 kg/m.   Treatment Plan Summary: Daily contact with patient to assess and evaluate symptoms and progress in treatment and Medication management    MDD, single episode, severe with psychotic features:  Patient received Abilify Maintena 400 mg IM on 10/12 -Abilify 10 mg daily  -discontinued Effexor - patient refused -Neuropsych testing ordered, as there is concern that this episode triggered by inability to keep up at work secondary to underlying cognitive deficits. MoCA was 25/30 (patient with college degree)  Anxiety/Sleep: -stop clonazepam - patient inconsistent/refusing. May be contributing to complaints of confusion and sedation.  -discontinue vistaril - anticholinergic can contribute to confusion in elderly -melatonin 5mg  pO QHS -restoril 15mg  PO QHS PRN Insomnia -Remeron 15mg  PO QHS for mood, anxiety, sleep and appetite -Ativan available PRN anxiety, and will have some tablets to go home with at discharge  Nutrition: Ergocalciferol 50 000 units PO weekly for vitamin D deficienct Stress B vitamin for B12 (and likely other Bs) deficiency  Other PRN's: Milk of Magnesia, Mylanta, Tylenol  Continue every 15 minute safety checks Encourage participation in the therapeutic milieu Discharge to home Science Salaam Battershell, MD 04/13/2021, 10:56 AM

## 2021-04-13 NOTE — Group Note (Signed)
LCSW Group Therapy Note  Group Date: 04/13/2021 Start Time: 1300 End Time: 1330   Type of Therapy and Topic:  Group Therapy - Healthy vs Unhealthy Coping Skills  Participation Level:  Active   Description of Group The focus of this group was to determine what unhealthy coping techniques typically are used by group members and what healthy coping techniques would be helpful in coping with various problems. Patients were guided in becoming aware of the differences between healthy and unhealthy coping techniques. Patients were asked to identify 2-3 healthy coping skills they would like to learn to use more effectively.  Therapeutic Goals Patients learned that coping is what human beings do all day long to deal with various situations in their lives Patients defined and discussed healthy vs unhealthy coping techniques Patients identified their preferred coping techniques and identified whether these were healthy or unhealthy Patients determined 2-3 healthy coping skills they would like to become more familiar with and use more often. Patients provided support and ideas to each other   Summary of Patient Progress:   Due to the acuity and complex discharge plans, group was not held. Patient was provided therapeutic worksheets and asked to meet with CSW as needed.    Therapeutic Modalities Cognitive Behavioral Therapy Motivational Interviewing  Mliss Fritz, Latanya Presser 04/13/2021  1:16 PM

## 2021-04-14 MED ORDER — LORAZEPAM 0.5 MG PO TABS: 0.5000 mg | ORAL_TABLET | ORAL | 0 refills | Status: AC | PRN

## 2021-04-14 MED ORDER — ARIPIPRAZOLE 10 MG PO TABS: 10.0000 mg | ORAL_TABLET | Freq: Every day | ORAL | 0 refills | Status: AC

## 2021-04-14 MED ORDER — MIRTAZAPINE 15 MG PO TABS: 15.0000 mg | ORAL_TABLET | Freq: Every day | ORAL | 0 refills | Status: AC

## 2021-04-14 MED ORDER — VITAMIN D (ERGOCALCIFEROL) 1.25 MG (50000 UNIT) PO CAPS: 50000.0000 [IU] | ORAL_CAPSULE | ORAL | 0 refills | Status: AC

## 2021-04-14 MED ORDER — TEMAZEPAM 15 MG PO CAPS: 15.0000 mg | ORAL_CAPSULE | Freq: Every evening | ORAL | 0 refills | Status: AC | PRN

## 2021-04-14 MED ORDER — MELATONIN 5 MG PO TABS: 5.0000 mg | ORAL_TABLET | Freq: Every day | ORAL | 0 refills | Status: AC

## 2021-04-14 MED ORDER — B COMPLEX-C PO TABS: 1.0000 | ORAL_TABLET | Freq: Every day | ORAL | 0 refills | Status: AC

## 2021-04-14 NOTE — Group Note (Signed)
LCSW Group Therapy Note  No therapy group could be held this day due to staffing issues.  Another licensed group was held.  Selmer Dominion, LCSW 04/14/2021 11:42 AM

## 2021-04-14 NOTE — Progress Notes (Signed)
D: Pt A & O X to self, place and situation. Denies SI, HI, AVH and pain at this time. D/C home as ordered. Picked up in lobby by his family.  A: D/C instructions reviewed with pt's family (wife, son & daughter in law) including prescriptions and follow up appointments; compliance encouraged. All belongings from locker 37 given to pt at time of departure. Scheduled medications given with verbal education and effects monitored. Safety checks maintained without incident till time of d/c.  R: Pt receptive to care. Compliant with medications when offered. Denies adverse drug reactions when assessed. Verbalized understanding related to d/c instructions. Pt signed belonging sheet in agreement with items received from locker. Ambulatory with a steady gait. Appears to be in no physical distress at time of departure.

## 2021-04-14 NOTE — B[facility] Group Notes (Signed)
.  Psychoeducational Group Note    Date:04/14/2021 Time: 1300-1400    Purpose of Group: . The group focus' on teaching patients on how to identify their needs and how Life Skills:  A group where two lists are made. What people need and what are things that we do that are healthy. The lists are developed by the patients and it is explained that we often do the actions that are not healthy to get our list of needs met.  to develop the coping skills needed to get their needs met  Participation Level:  minimally Active  Participation Quality:  Appropriate  Affect: paranoid  Cognitive: unable to assess  Insight:  Iacking  Engagement in Group:  semi Engaged  Additional Comments:  Pt attending the group. Sits and will answer when asked a question, otherwise just listens. Affect is paranoid and suspicious  Joshua Lloyd

## 2021-04-14 NOTE — Plan of Care (Signed)
°  Problem: Education: °Goal: Emotional status will improve °Outcome: Progressing °Goal: Mental status will improve °Outcome: Progressing °Goal: Verbalization of understanding the information provided will improve °Outcome: Progressing °  °

## 2021-04-14 NOTE — Progress Notes (Signed)
     04/13/21 2130  Psychosocial Assessment  Patient Complaints None  Eye Contact Fair  Facial Expression Anxious;Worried  Affect Anxious;Preoccupied  Speech Logical/coherent;Pressured  Interaction Cautious  Motor Activity Slow  Appearance/Hygiene Unremarkable  Behavior Characteristics Cooperative;Calm  Mood Pleasant;Anxious  Thought Process  Coherency Concrete thinking  Content Paranoia  Delusions None reported or observed  Perception WDL  Hallucination None reported or observed  Judgment Impaired  Confusion None  Danger to Self  Current suicidal ideation? Denies  Danger to Others  Danger to Others None reported or observed

## 2021-04-14 NOTE — BHH Group Notes (Signed)
.  Psychoeducational Group Note  Date: 04/14/2021 Time: 0900-1000    Goal Setting   Purpose of Group: This group helps to provide patients with the steps of setting a goal that is specific, measurable, attainable, realistic and time specific. A discussion on how we keep ourselves stuck with negative self talk.    Participation Level:  Active  Participation Quality:  lacking  Affect:  appears paranoid  Cognitive:  paranoid  Insight:  lacking   Engagement in Group:  Engaged. Answered questions. Did not volunteer anything on his own.  Additional Comments:  Pt stopped this Probation officer in the hall after group and stated "I am doing ok. I just wanted you to know that I am a little tired. I did not sleep well last night. Pt rated his energy level a 4/10.  Paulino Rily

## 2021-04-14 NOTE — Progress Notes (Signed)
[facility] Adult Case Management Discharge Plan :  Will you be returning to the same living situation after discharge:  Yes,  home with wife At discharge, do you have transportation home?: Yes,  wife and son to pick up Do you have the ability to pay for your medications: Yes,  insurance and income  Release of information consent forms completed and emailed to Medical Records, then turned in to Medical Records by CSW.   Patient to Follow up at:  Follow-up [place], [facility]. Go on 04/26/2021.   Specialty: Behavioral Health Why: You have an appointment for therapy services on 04/26/21 at 11:00 am.  This appointment will be held in person.  You also have a psychiatric medication management appointment with Dr. Casimiro Needle scheduled for 05/07/21 at 9:30 am. Contact information: [place] [id number] [phone]         [place]: Neuropsychology. Call.   Why: We have put in a referral for neurocognitive testing follow up.  They should reach out to you in 3 to 6 months to set up an appointment.  Please follow up with appropriate cognitive testing. Contact information: [address]  [place],  [address] Tel:  [phone] Tel:  [phone]                Next level of care provider has access to [place] and Suicide Prevention discussed: Yes,  with wife   CSW had multiple phone calls with wife Joshua Lloyd [phone] on this day of discharge to make arrangements.  She was able to get their son and his wife to drive in from another city to support her during the process of discharge.  She asked for a family meeting to be held at discharge to go over the care plan and instructions.  Nurse Noah Delaine and CSW met with patient, his wife, his son, and his daughter-in-law in the search room to review discharge plan.  This took approximately 50 minutes.  The  patient had been unwilling to sign Release of Information forms for his aftercare providers earlier in the day, so these were provided and explained in detail to the family before wife was able to get him to sign them, as his paranoia that he was compromising his legal rights remained.  CSW offered a copy of the ROIs and this was accepted.    The choice between current psychiatrist ([facility]) and a new provider Press photographer) was explained as being arranged because while the family wanted the patient to return to current provider, he was opposed so a second option was put in place.  The family ultimately decided that the second option should be removed, so this was done and the ROI was destroyed.  The referral to [facility] Neurology was also explained, which the family still did not comprehend, so nurse and CSW reviewed the doctor's note in discharge paperwork about the [facility] screening.  Wife disagreed that anything cognitive could be the cause of patient's decline, stating that the onset was too rapid.    Of note, there are other notes in Epic that indicate this has previously been discussed with wife and family.  Family wanted to know what the care plan is to be between this time of discharge and when he can be scheduled for a neuropsych evaluation, was informed that they keep doing what they are doing.  They were  also informed about another psychology practice in the state that is currently scheduling some evaluations in November, but also that Dr. Casimiro Needle, the outpatient psychiatrist would need to do a referral.    Throughout this process, patient remained somewhat agitated, kept telling his son and daughter-in-law to go home and his wife that he did not understand what was going on.  Specifically he seemed worried that when we talked about getting him "in" to see someone for testing, that we might be talking about inpatient.  CSW apologized for the wording and explained to him what we were  talking about, but this did not seem to help him.  The family and patient also expressed concern that the patient had received a summons to go to court this coming week.  It was explained at length that this was a legal process that will be halted as soon as the legal system finds out he has been discharged.  Nonetheless they stated that the patient should not have been subjected to this summons.  CSW explained this is a legal process that we cannot prevent and that we must follow the law, but they remained unhappy about the effect it had on him.  As we wrapped up, wife and son stated they feel the treatment at this institution was very poor and that they had received no help at all.  CSW responded supportively but also shared that the hospital staff had followed treatment protocols and that these likely were protocols that other institutions would also employ.  They expressed specific anger with the communication, stating it was "horrid."  Again, CSW empathized and encouraged them to follow the care plan, at which point they departed.  Has patient been referred to the Quitline?: N/A patient is not a smoker  Patient has been referred for addiction treatment: N/A  Maretta Los, LCSW 04/14/2021, 5:07 PM

## 2021-04-14 NOTE — Discharge Summary (Signed)
Physician Discharge Summary Note  Patient:  Joshua Lloyd is an 60 y.o., male MRN:  [id number] DOB:  [birth year] Patient phone:  [phone] (home)  Patient address:   [place] [address],  Total Time spent with patient:  Greater than 30 minutes  Date of Admission:  04/06/2021  Date of Discharge: 04-14-21  Reason for Admission: Worsening insomnia, anxiety symptoms & feeling of paranoia.  Principal Problem: MDD (major depressive disorder), single episode, severe with psychosis ([place])  Discharge Diagnoses: Principal Problem:   MDD (major depressive disorder), single episode, severe with psychosis ([place]) Active Problems:   Anxiety disorder, unspecified   B12 deficiency   Vitamin D deficiency   Cognitive and behavioral changes  Past Psychiatric History: Major depressive disorder, severe with psychosis.  Past Medical History:  Past Medical History:  Diagnosis Date   Cancer [facility])    prostate    Past Surgical History:  Procedure Laterality Date   NO PAST SURGERIES     ROBOT ASSISTED LAPAROSCOPIC RADICAL PROSTATECTOMY N/A 10/17/2016   Procedure: ROBOTIC ASSISTED LAPAROSCOPIC RADICAL PROSTATECTOMY LEVEL 2/ BILATERAL PELVIC LYMPHADENECTOMY;  Surgeon: Raynelle Bring, MD;  Location: [facility];  Service: Urology;  Laterality: N/A;   WRIST SURGERY     skiing accident when 60 years old   Family History: History reviewed. No pertinent family history.  Family Psychiatric  History: See H&P.  Social History:  Social History   Substance and Sexual Activity  Alcohol Use Yes   Alcohol/week: 1.0 standard drink   Types: 1 Glasses of wine per week   Comment: ocassionally     Social History   Substance and Sexual Activity  Drug Use No    Social History   Socioeconomic History   Marital status: Married    Spouse name: Not on file   Number of children: Not on file   Years of education: Not on file   Highest education level: Not on file  Occupational  History   Not on file  Tobacco Use   Smoking status: Never   Smokeless tobacco: Never  Substance and Sexual Activity   Alcohol use: Yes    Alcohol/week: 1.0 standard drink    Types: 1 Glasses of wine per week    Comment: ocassionally   Drug use: No   Sexual activity: Yes  Other Topics Concern   Not on file  Social History Narrative   Not on file   Social Determinants of Health   Financial Resource Strain: Not on file  Food Insecurity: Not on file  Transportation Needs: Not on file  Physical Activity: Not on file  Stress: Not on file  Social Connections: Not on file   Hospital Course: (Per Md's admission SRA notes): The patient is a 60y/o male who presented initially as a walk-in to [facility] on 10/13 accompanied by his wife for evaluation of sleeplessness, anxiety, and paranoia. The patient was placed under IVC and was transferred to [facility] for continued management.  On exam, the patient is a poor historian and is reluctant to engage in an interview making history difficult to obtain. 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 He states he did not feel he was performing up to his usual work standards and started feeling extreme stress in his job. He states he saw  his PCP who initially put him on "low dose" Zoloft for 2-3 weeks and then referred him to see a psychiatrist (Dr. Casimiro Needle). He thinks that Dr. Casimiro Needle changed him off Zoloft and started him on Remeron and low dose Klonopin to help with his sleep and anxiety. He admits that in the last 3-4 weeks he has been paranoid at home and has thoughts that he is under surveillance by unknown persons related to his job performance. In review of his records, he reportedly believed he was being monitored by "high tech technology" and had belief that he was being  followed, that the feds were coming after him, and was agitated at home. He reportedly was sleeping with his clothes on and wallet and keys in his pockets secondary to paranoia. He denies AVH, first rank symptoms, or magical thinking but admits that he "gets messages from the Bible" when he reads scriptures which are personal to him. He denies receiving other messages from TV or electrical appliances and becomes defensive and avoidant when questioned about any hyper-religious thinking. He admits that even on the inpatient unit he still perceives that he "is being monitored" but will not give details.  According to his records, he was prescribed Abilify by Dr. Casimiro Needle for signs of paranoia but only took one dose before stopping the medication. He reportedly received Abilify Maintena 400mg  IM on 04/04/21 by Dr. Casimiro Needle but states he has not continued any oral bridging antipsychotic since receiving the LAI. He admits that for the last 3-4 weeks he has been misplacing things, forgetful, feeling foggy and confused, and is worried he will lose things. His wife has not been letting him drive he states because he was getting lost and having trouble with GPS. He states that he has been feeling sad and down for the last 3-4 weeks with associated low appetite (15 lb weight loss), insomnia, anhedonia, guilt about job performance, low energy, poor focus, and hopeless thinking. He denies SI or HI and denies previous suicide attempts. He denies h/o TBI or seizures and denies any significant PMH. He denies h/o mania or hypomania. He denies known psychiatric illness in the family and denies any alcohol or illicit drug use. On exam he is paranoid and guarded, frequently looks around the room as if being watched, and requests to read my ID badge, refuses to engage in meaningful conversation about medications, and reports he feels staff are "torturing him" with questions and lab draws. He is irritable, defensive, and argumentative on  exam. Extensive time was spent discussing treatment options, r/b/se/a to medication options, and recommendations.  Prior to this discharge, [place] was seen & evaluated for mental health stability. The current laboratory findings were reviewed (stable). The nurses notes & vital signs were reviewed as well. There are no current mental health or medical issues that should prevent this discharge at this time. Patient is being discharged to continue mental health care & medication management as noted below.   This is Latoya's first psychiatric admission/discharge summary from this [facility]. He was admitted under IVC with complaint of a new onset psychosis (worsening insomnia, anxiety symptoms & feeling of paranoia). He was apparently a poor historian on admission & was reluctant to engage in an interview making history difficult to obtain at the time. He estimated on admission that he started having anxiety symptoms at work about 3-4 weeks prior to this admission in his role in the insurance business. He attributed his anxiety to work pressures & his inability to keep  up with the technology changes. After the above admission evaluation, Yuvraj was recommended for mood stabilization treatments by his treatment team. And with his consent, he was treated, stabilized & discharged on the medications as listed below on his discharge medication lists. He was also enrolled & participated in the group counseling sessions being offered & held on this unit. He learned coping skills to help him after discharge to cope better. Other than some vitamin supplementation, Malakhi presented no other significant pre-existing medical conditions that required treatment & monitoring. He tolerated his treatment regimen without any adverse effects or reactions reported.   Jaasiel's symptoms responded well this his treatment regimen warranting this discharge. This is evidenced by his reports of improved mood/paranoia. He is also medically stable &  agreeable to this discharge. He has multiple follow-up psychiatric care visits/medication management & counseling services scheduled. He is recommended/referred for a neuro-cognitive testing upon discharge to help determine the cause/origin of his sudden mental health/behavioral changes.   During the course of this hospitalization, the 15-minute checks were adequate to ensure Ramiro's safety. Patient did not display any dangerous, violent or suicidal behavior on the unit.  He interacted with patients & staff appropriately. He participated appropriately in the group sessions/therapies being offered & held on this unit. He learned coping skills. His medications were addressed & adjusted to meet his needs. He was recommended for outpatient follow-up care & medication management upon discharge to assure continuity of care.  At the time of discharge, patient is not reporting any acute suicidal/homicidal ideations. He currently denies any new issues or concerns. Education and supportive counseling provided throughout his hospital stay & upon discharge.   Today upon his discharge evaluation with the attending psychiatrist, Jarquis shares he is doing well. He denies any other specific concerns. His sleep/anxiety symptoms improved & yet he continues to require/discharged on sleep/anxiety aids. His appetite is good. He denies other physical complaints. He denies AH/VH, delusional thoughts. However, his paranoia has improved. He feels that his medications have been helpful & is in agreement to continue his current treatment regimen as recommended. He was able to engage in safety planning including plan to return to [facility] or contact emergency services if he feels unable to maintain his own safety or the safety of others. Pt had no further questions, comments, or concerns. He left [facility] with all personal belongings in no apparent distress. Transportation per his family.   Physical Findings: AIMS:  , ,  ,  ,    CIWA:    COWS:      Musculoskeletal: Strength & Muscle Tone: within normal limits Gait & Station: normal Patient leans: N/A  Psychiatric Specialty Exam:  Presentation  General Appearance: Appropriate for Environment; Casual; Well Groomed  Eye Contact:Good  Speech:Clear and Coherent; Normal Rate  Speech Volume:Normal  Handedness:Right  Mood and Affect  Mood:Euthymic  Affect:Appropriate; Congruent  Thought Process  Thought Processes:Coherent  Descriptions of Associations:Intact  Orientation:Full (Time, Place and Person)  Thought Content:Logical  History of Schizophrenia/Schizoaffective disorder:No  Duration of Psychotic Symptoms:N/A  Hallucinations:Hallucinations: None Ideas of Reference:None  Suicidal Thoughts:Suicidal Thoughts: No Homicidal Thoughts:Homicidal Thoughts: No  Sensorium  Memory:Recent Good; Immediate Good; Remote Good  Judgment:Good  Insight:Fair  Executive Functions  Concentration:Good  Attention Span:Good  [place] of Knowledge:Good  Language:Good  Psychomotor Activity  Psychomotor Activity:Psychomotor Activity: Normal  Assets  Assets:Communication Skills; Desire for Improvement; Financial Resources/Insurance; Housing; Physical Health; Social Support; Vocational/Educational  Sleep  Sleep:Sleep: Poor Number of Hours of Sleep: 3.75 (  Patient is being discharged on Restoril, Mirtazapine & Restoril.)  Physical Exam: Physical Exam Vitals and nursing note reviewed.  HENT:     Nose: Nose normal.     Mouth/Throat:     Pharynx: Oropharynx is clear.  Eyes:     Pupils: Pupils are equal, round, and reactive to light.  Genitourinary:    Comments: Deferred Musculoskeletal:        General: Normal range of motion.  Skin:    General: Skin is warm and dry.  Neurological:     General: No focal deficit present.     Mental Status: He is alert and oriented to person, place, and time. Mental status is at baseline.   Review of Systems   Constitutional:  Negative for chills, diaphoresis and fever.  HENT:  Negative for congestion and sore throat.   Eyes:  Negative for blurred vision.  Respiratory:  Negative for cough, shortness of breath and wheezing.   Cardiovascular:  Negative for chest pain and palpitations.  Gastrointestinal:  Negative for abdominal pain, blood in stool, constipation, diarrhea, heartburn, melena, nausea and vomiting.  Genitourinary:  Negative for dysuria.  Musculoskeletal:  Negative for joint pain and myalgias.  Skin: Negative.   Neurological:  Negative for dizziness, tingling, tremors, sensory change, speech change, focal weakness, seizures, loss of consciousness, weakness and headaches.  Endo/Heme/Allergies:  Negative for environmental allergies and polydipsia. Does not bruise/bleed easily.       Allergies: Prednisone  Psychiatric/Behavioral:  Positive for depression (Hx of (stable on medication)). Negative for hallucinations, memory loss, substance abuse and suicidal ideas. The patient has insomnia (Hx. of (stable on medication).). The patient is not nervous/anxious (Hx of (stable on medication)).   Blood pressure 107/78, pulse 97, temperature 97.7 F (36.5 C), temperature source Oral, resp. rate 16, height 6\' 1"  (1.854 m), weight 78.5 kg, SpO2 97 %. Body mass index is 22.82 kg/m.  Social History   Tobacco Use  Smoking Status Never  Smokeless Tobacco Never   Tobacco Cessation:  N/A, patient does not currently use tobacco products  Blood Alcohol level:  Lab Results  Component Value Date   ETH <10 97/67/3419    Metabolic Disorder Labs:  Lab Results  Component Value Date   HGBA1C 5.7 (H) 04/05/2021   MPG 116.89 04/05/2021   Lab Results  Component Value Date   PROLACTIN 25.3 (H) 04/09/2021   PROLACTIN 21.7 (H) 04/08/2021   Lab Results  Component Value Date   CHOL 180 04/05/2021   TRIG 119 04/05/2021   HDL 59 04/05/2021   CHOLHDL 3.1 04/05/2021   VLDL 24 04/05/2021   [place] 97  04/05/2021   See Psychiatric Specialty Exam and Suicide Risk Assessment completed by Attending Physician prior to discharge.  Discharge destination:  Home  Is patient on multiple antipsychotic therapies at discharge:  No   Has Patient had three or more failed trials of antipsychotic monotherapy by history:  No  Recommended Plan for Multiple Antipsychotic Therapies: NA  Allergies as of 04/14/2021       Reactions   Prednisone Rash        Medication List     STOP taking these medications    clonazePAM 1 MG tablet Commonly known as: KLONOPIN       TAKE these medications      Indication  ARIPiprazole 10 MG tablet Commonly known as: ABILIFY Take 1 tablet (10 mg total) by mouth daily. Psychosis Start taking on: April 15, 2021  Indication: Psychosis   B-complex  with vitamin C tablet Take 1 tablet by mouth daily. For nutritional supplementation Start taking on: April 15, 2021  Indication: Nutritional supplementation   LORazepam 0.5 MG tablet Commonly known as: ATIVAN Take 1 tablet (0.5 mg total) by mouth every 4 (four) hours as needed for anxiety.  Indication: Feeling Anxious   melatonin 5 MG Tabs Take 1 tablet (5 mg total) by mouth at bedtime. For sleep  Indication: Trouble Sleeping   mirtazapine 15 MG tablet Commonly known as: REMERON Take 1 tablet (15 mg total) by mouth at bedtime. For depression/sleep What changed:  medication strength how much to take additional instructions  Indication: Major Depressive Disorder, Insomnia   temazepam 15 MG capsule Commonly known as: RESTORIL Take 1 capsule (15 mg total) by mouth at bedtime as needed for sleep.  Indication: Trouble Sleeping   Vitamin D (Ergocalciferol) 1.25 MG (50000 UNIT) Caps capsule Commonly known as: DRISDOL Take 1 capsule (50,000 Units total) by mouth every 7 (seven) days. For bone health Start taking on: April 18, 2021  Indication: [place]. Go on 04/26/2021.   Specialty: Behavioral Health Why: You have an appointment for therapy services on 04/26/21 at 11:00 am.  This appointment will be held in person.  You also have a psychiatric medication management appointment with Dr. Casimiro Needle scheduled for 05/07/21 at 9:30 am (attempted to cancel this appointment as you have one with other provider). Contact information: [place] [address] [phone]         [place]: Neuropsychology. Call.   Why: We have put in a referral for neurocognitive testing follow up.  They should reach out to you in 3 to 6 months to set up an appointment.  Please follow up with appropriate Cognitive testing. Contact information: [place]  [place],  [id number]        [place]. Go on 05/03/2021.   Why: You have a hospital follow up appointment to obtain medication management services on 05/03/21 at 9:00 am.  This appointment will be held in person. Contact information: [place], [place],  [address]  Phone: [phone] Fax:  [phone]               Follow-up recommendations: Activity:  As tolerated Diet: As recommended by your primary care doctor. Keep all scheduled follow-up appointments as recommended.    Comments: Prescriptions given at discharge.  Patient agreeable to plan.  Given opportunity to ask questions.   Appears to feel comfortable with discharge denies any current suicidal or homicidal thought. Patient is also instructed prior to discharge to: Take all medications as prescribed by his/her mental healthcare provider. Report any adverse effects and or reactions from the medicines to his/her outpatient provider promptly. Patient has been instructed & cautioned: To not engage in alcohol and or illegal drug use while on prescription medicines. In the event  of worsening symptoms, patient is instructed to call the crisis hotline, 911 and or go to the nearest ED for appropriate evaluation and treatment of symptoms. To follow-up with his/her primary care provider for your other medical issues, concerns and or health care needs.   Signed: Lindell Spar, NP, pmhnp, fnp-bc 04/14/2021, 9:29 AM

## 2021-04-14 NOTE — BHH Suicide Risk Assessment (Signed)
[facility] Discharge Suicide Risk Assessment   Principal Problem: MDD (major depressive disorder), single episode, severe with psychosis ([place]) Discharge Diagnoses: Principal Problem:   MDD (major depressive disorder), single episode, severe with psychosis ([place]) Active Problems:   Anxiety disorder, unspecified   B12 deficiency   Vitamin D deficiency   Cognitive and behavioral changes   Total Time spent with patient: 30 minutes  Musculoskeletal: Strength & Muscle Tone: within normal limits Gait & Station: normal Patient leans: N/A  Psychiatric Specialty Exam  Presentation  General Appearance: Appropriate for Environment; Casual; Well Groomed  Eye Contact:Good  Speech:Clear and Coherent; Normal Rate  Speech Volume:Normal  Handedness:Right   Mood and Affect  Mood:Euthymic  Duration of Depression Symptoms: Greater than two weeks  Affect:Appropriate; Congruent   Thought Process  Thought Processes:Coherent  Descriptions of Associations:Intact  Orientation:Full (Time, Place and Person)  Thought Content:Logical  History of Schizophrenia/Schizoaffective disorder:No  Duration of Psychotic Symptoms:N/A  Hallucinations:Hallucinations: None  Ideas of Reference:None  Suicidal Thoughts:Suicidal Thoughts: No  Homicidal Thoughts:Homicidal Thoughts: No   Sensorium  Memory:Recent Good; Immediate Good; Remote Good  Judgment:Good  Insight:Fair   Executive Functions  Concentration:Good  Attention Span:Good  [place] of Knowledge:Good  Language:Good   Psychomotor Activity  Psychomotor Activity:Psychomotor Activity: Normal   Assets  Assets:Communication Skills; Desire for Improvement; Financial Resources/Insurance; Housing; Physical Health; Social Support; Vocational/Educational   Sleep  Sleep:Sleep: Poor Number of Hours of Sleep: 3.75 (Patient is being discharged on Restoril, Mirtazapine & Restoril.)   Physical Exam: Physical Exam Vitals  and nursing note reviewed.  Constitutional:      Appearance: Normal appearance.  HENT:     Head: Normocephalic and atraumatic.     Nose: Nose normal.  Eyes:     Extraocular Movements: Extraocular movements intact.  Pulmonary:     Effort: Pulmonary effort is normal.  Musculoskeletal:     Cervical back: Normal range of motion.  Neurological:     General: No focal deficit present.     Mental Status: He is alert and oriented to person, place, and time.  Psychiatric:        Attention and Perception: He does not perceive auditory or visual hallucinations.        Mood and Affect: Mood is anxious.        Speech: Speech normal.        Behavior: Behavior is cooperative.        Thought Content: Thought content does not include homicidal or suicidal ideation.     Comments: Mild delusions, fixed, with paranoid theme Distractability+   ROS Blood pressure 107/78, pulse 97, temperature 97.7 F (36.5 C), temperature source Oral, resp. rate 16, height 6\' 1"  (1.854 m), weight 78.5 kg, SpO2 97 %. Body mass index is 22.82 kg/m.  Mental Status Per Nursing Assessment::   On Admission:  NA  Demographic Factors:  Male and Caucasian  Loss Factors: Work stressors  Historical Factors:  job stressors, recent psychiatric outpatient diagnoses/treatments Risk Reduction Factors:  Positive social support, Positive therapeutic relationship, Living with another person, especially a relative  Continued Clinical Symptoms:  Severe Anxiety and/or Agitation Depression:   Delusional  Cognitive Features That Contribute To Risk:  Closed-mindedness, Loss of executive function, and Thought constriction (tunnel vision)    Suicide Risk:  Mild:  Suicidal ideation of limited frequency, intensity, duration, and specificity.  There are no identifiable plans, no associated intent, mild dysphoria and related symptoms, good self-control (both objective and subjective assessment), few other risk factors, and  identifiable  protective factors, including available and accessible social support.   Follow-up [place]. Go on 04/26/2021.   Specialty: Behavioral Health Why: You have an appointment for therapy services on 04/26/21 at 11:00 am.  This appointment will be held in person.  You also have a psychiatric medication management appointment with Dr. Casimiro Needle scheduled for 05/07/21 at 9:30 am (attempted to cancel this appointment as you have one with other provider). Contact information: [place] [address] [phone]         [place]: Neuropsychology. Call.   Why: We have put in a referral for neurocognitive testing follow up.  They should reach out to you in 3 to 6 months to set up an appointment.  Please follow up with appropriate Cognitive testing. Contact information: [address]  [place], [place] [address]        [place]. Go on 05/03/2021.   Why: You have a hospital follow up appointment to obtain medication management services on 05/03/21 at 9:00 am.  This appointment will be held in person. Contact information: [address], [place], [place] [address]  Phone: [phone] Fax:  [phone]                Plan Of Care/Follow-up recommendations:  Activity:  as tolerated Diet:  cardiac diet, with dietary support from vitamins as prescribed Tests:  neuropsychiatric testing, referred Other:  follow up with primary care for retesting of nutritional status Advised not to drive unless cleared by on the road testing via [place] [employer]V or neuropsychiatric testing or physician   Prescriptions for new medications provided for the patient to bridge to follow up appointment. The patient was informed that refills for these prescriptions are generally not provided, and patient is encouraged to attend all follow up appointments to address medication  refills and adjustments.   Today's discharge was reviewed with treatment team, and the team is in agreement that the patient is ready for discharge. The patient is was of the discharge plan for today and has been given opportunity to ask questions. At time of discharge, the patient does not vocalize any acute harm to self or others, is goal directed, able to advocate for self and organizational baseline.   At discharge, the patient is instructed to:  Take all medications as prescribed. Report any adverse effects and or reactions from the medicines to her outpatient provider promptly.  Do not engage in alcohol and/or illegal drug use while on prescription medicines.  In the event of worsening symptoms, patient is instructed to call the crisis hotline, 911 and or go to the nearest ED for appropriate evaluation and treatment of symptoms.  Follow-up with primary care provider for further care of medical issues, concerns and or health care needs.   Maida Sale, MD 04/14/2021, 10:36 AM

## 2021-04-14 NOTE — Group Note (Signed)
Group Topic: Goal Setting  Group Date: 04/14/2021 Start Time: 0900 End Time: 0930 Facilitators: Kathreen Cornfield  Department: [place] ADULT [facility]  Number of Participants: 9  Group Focus:  Treatment Modality:  Skills Training Interventions utilized were orientation Purpose: Orientation of the unit and awareness of rules and regulations, also daily schedule and visitation hours. Helping patients become familiar with who Patients Doctors, Nurses, and Techs are.  Name: Joshua Lloyd Date of Birth: 02-Jun-1961  MR: [id number]    Level of Participation: moderate Quality of Participation: engaged Interactions with others: appropriate Mood/Affect: appropriate Triggers (if applicable): n/a Cognition:  Progress: Gaining insight Response:  Plan: patient will be encouraged to   Patients Problems:  Patient Active Problem List   Diagnosis Date Noted   Cognitive and behavioral changes 04/11/2021   B12 deficiency 04/10/2021   Vitamin D deficiency 04/10/2021   MDD (major depressive disorder), single episode, severe with psychosis ([place]) 04/07/2021   Anxiety disorder, unspecified 04/07/2021   Psychosis ([place])    Prostate cancer ([place]) 10/17/2016

## 2021-04-18 DIAGNOSIS — F322 Major depressive disorder, single episode, severe without psychotic features: Secondary | ICD-10-CM | POA: Diagnosis not present

## 2021-04-18 DIAGNOSIS — E538 Deficiency of other specified B group vitamins: Secondary | ICD-10-CM | POA: Diagnosis not present

## 2021-04-19 DIAGNOSIS — F322 Major depressive disorder, single episode, severe without psychotic features: Secondary | ICD-10-CM | POA: Diagnosis not present

## 2021-04-20 DIAGNOSIS — E538 Deficiency of other specified B group vitamins: Secondary | ICD-10-CM | POA: Diagnosis not present

## 2021-04-23 DIAGNOSIS — E538 Deficiency of other specified B group vitamins: Secondary | ICD-10-CM | POA: Diagnosis not present

## 2021-04-24 DIAGNOSIS — E538 Deficiency of other specified B group vitamins: Secondary | ICD-10-CM | POA: Diagnosis not present

## 2021-04-25 DIAGNOSIS — D510 Vitamin B12 deficiency anemia due to intrinsic factor deficiency: Secondary | ICD-10-CM | POA: Diagnosis not present

## 2021-04-25 DIAGNOSIS — F322 Major depressive disorder, single episode, severe without psychotic features: Secondary | ICD-10-CM | POA: Diagnosis not present

## 2021-04-25 DIAGNOSIS — E538 Deficiency of other specified B group vitamins: Secondary | ICD-10-CM | POA: Diagnosis not present

## 2021-04-25 DIAGNOSIS — F419 Anxiety disorder, unspecified: Secondary | ICD-10-CM | POA: Diagnosis not present

## 2021-04-26 DIAGNOSIS — F4323 Adjustment disorder with mixed anxiety and depressed mood: Secondary | ICD-10-CM | POA: Diagnosis not present

## 2021-04-26 DIAGNOSIS — F251 Schizoaffective disorder, depressive type: Secondary | ICD-10-CM | POA: Diagnosis not present

## 2021-05-01 DIAGNOSIS — E538 Deficiency of other specified B group vitamins: Secondary | ICD-10-CM | POA: Diagnosis not present

## 2021-05-08 DIAGNOSIS — E538 Deficiency of other specified B group vitamins: Secondary | ICD-10-CM | POA: Diagnosis not present

## 2021-05-10 DIAGNOSIS — F600 Paranoid personality disorder: Secondary | ICD-10-CM | POA: Diagnosis not present

## 2021-05-10 DIAGNOSIS — F4323 Adjustment disorder with mixed anxiety and depressed mood: Secondary | ICD-10-CM | POA: Diagnosis not present

## 2021-05-15 DIAGNOSIS — D510 Vitamin B12 deficiency anemia due to intrinsic factor deficiency: Secondary | ICD-10-CM | POA: Diagnosis not present

## 2021-05-22 DIAGNOSIS — F418 Other specified anxiety disorders: Secondary | ICD-10-CM | POA: Diagnosis not present

## 2021-05-22 DIAGNOSIS — F322 Major depressive disorder, single episode, severe without psychotic features: Secondary | ICD-10-CM | POA: Diagnosis not present

## 2021-05-22 DIAGNOSIS — E538 Deficiency of other specified B group vitamins: Secondary | ICD-10-CM | POA: Diagnosis not present

## 2021-05-24 DIAGNOSIS — F4323 Adjustment disorder with mixed anxiety and depressed mood: Secondary | ICD-10-CM | POA: Diagnosis not present

## 2021-05-24 DIAGNOSIS — F600 Paranoid personality disorder: Secondary | ICD-10-CM | POA: Diagnosis not present

## 2021-05-25 DIAGNOSIS — N3000 Acute cystitis without hematuria: Secondary | ICD-10-CM | POA: Diagnosis not present

## 2021-05-28 DIAGNOSIS — F251 Schizoaffective disorder, depressive type: Secondary | ICD-10-CM | POA: Diagnosis not present

## 2021-05-29 DIAGNOSIS — E538 Deficiency of other specified B group vitamins: Secondary | ICD-10-CM | POA: Diagnosis not present

## 2021-06-26 DIAGNOSIS — F419 Anxiety disorder, unspecified: Secondary | ICD-10-CM | POA: Diagnosis not present

## 2021-06-26 DIAGNOSIS — F324 Major depressive disorder, single episode, in partial remission: Secondary | ICD-10-CM | POA: Diagnosis not present

## 2021-06-26 DIAGNOSIS — D510 Vitamin B12 deficiency anemia due to intrinsic factor deficiency: Secondary | ICD-10-CM | POA: Diagnosis not present

## 2021-06-26 DIAGNOSIS — R5383 Other fatigue: Secondary | ICD-10-CM | POA: Diagnosis not present

## 2021-07-02 DIAGNOSIS — F251 Schizoaffective disorder, depressive type: Secondary | ICD-10-CM | POA: Diagnosis not present

## 2021-07-06 ENCOUNTER — Other Ambulatory Visit: Payer: Self-pay

## 2021-07-06 ENCOUNTER — Ambulatory Visit (INDEPENDENT_AMBULATORY_CARE_PROVIDER_SITE_OTHER): Payer: BC Managed Care – PPO | Admitting: Psychiatry

## 2021-07-06 DIAGNOSIS — F419 Anxiety disorder, unspecified: Secondary | ICD-10-CM

## 2021-07-06 DIAGNOSIS — F323 Major depressive disorder, single episode, severe with psychotic features: Secondary | ICD-10-CM

## 2021-07-06 NOTE — Progress Notes (Unsigned)
PROBLEM-FOCUSED INITIAL PSYCHOTHERAPY EVALUATION Luan Moore, PhD LP [facility]  Name: NIJEE HEATWOLE Date: 07/06/2021 Time spent: 27 min MRN: [id number] DOB: 05/24/1961 Guardian/Payee: self  PCP: Lavone Orn, MD Documentation requested on this visit: No  PROBLEM HISTORY Reason for Visit /Presenting Problem:  Chief Complaint  Patient presents with   Establish Care   Depression   Altered Mental Status   Anxiety    Narrative/History of Present Illness Referred by Donneta Romberg, MD for transitional care of a reportedly psychotic mood disorder.  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  Had been seeing a therapist at [facility] but felt it was not meeting the need.  EHR notes 04/05/21 presentation to [facility] with involuntary commitment.  PCP had put him on Zoloft, developed paranoia that men were looking into his house, stopped SSRI , referred to Dr. Casimiro Needle, began Remeron, and low-dose Klonopin, added Abilify 8 days before admission but stopped after 1st dose.  Required Abilify injection 10/12.  At time of hospitalization, felt scrutinized, "monitored".  Notable apprehension about medication in general.  Observed thought blocking and weight loss at the time.  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  Vague about his duties, hard to describe, but focussed on technology (computer system, different software, processes) that he felt he couldn't learn, or in his words, "couldn't perform at the level I needed to."  Always been a Type A driven person before, confidence shaken by this.  Now restless to get back to work, bored and suggestion he has felt ashamed being home on FMLA  Last Sunday was first  time back to the office, picked up some mail.  [name] says he has been largely homebound, sees his anxiety  Personal life, has been unable to organize himself to visit his mother, or drive.  Have been able to get back to church the last two Sundays, in c/o [place].    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  Saw a Social worker at [employer], last in late December.    Physically, then had COVID 2nd time [place].  Previous COVID Feb of 2021.  Enduring partial/spotty loss of taste.  Have discovered B12 deficieincy (pernicious anemia).  Got a series of injections, weaning.  Has tested low on vitamin D according to [place], but Desiderio says he's never heard about that, PCP has never mentioned.  (EHR shows Rx for weekly 50,000IU.)    Sleeping well according to both, with lorazepam   [problem, parameters, solutions in practice, personal theories what is going on]  Prior Psychiatric Assessment/Treatment:   Outpatient treatment: *** Psychiatric hospitalization: *** Psychological assessment/testing: {PSY:21014032}   Abuse/neglect screening: Victim of abuse: {PSY:22524::"No"}.   Victim of neglect: {PSY:22524::"No"}.   Perpetrator of abuse/neglect: {PSY:22524::"No"}.   Witness / Exposure to Domestic Violence: {AMYesNo:22526::"No"}.   Witness to Community Violence:  {AMYesNo:22526::"No"}.   Protective Services Involvement: {AMYesNo:22526::"No"}.   Report needed: {AMYesNo:22526::"No"}.    Substance abuse screening: Current substance abuse: {AMYesNo:22526::"No"}.   History of impactful substance use/abuse: {AMYesNo:22526::"No"}.     FAMILY/SOCIAL HISTORY Family of origin --  Family of intention/current living situation --  PT {lives:315711::"lives with their family"}.  Living conditions described as ***.  Self-reported sexual orientation: {Sexual [phone]}, presently {Desc; marital  status:62}.  Notable issues among family members include ***. Education --  Highest level of  education is {PSY:31912}, with best achievement/interest noted in ***.   Vocation --  Notable work history includes ***. Finances --  Current income from {Source of Income:[phone]}, with *** concerns. Spiritually --  Spiritual/religious identification {CHL AMB RELIGION/SPIRITUALITY:[phone]}.  Practicing: {AMYesNo:22526::"No"} Enjoyable activities -- *** Other situational factors affecting treatment and prognosis: Stressors from the following areas: {AM Stressors:23445} Barriers to service: ***  Notable cultural sensitivities: {Religious/Cultural:200019} Strengths: {Patient Coping [phone]}   MED/SURG HISTORY Med/surg history was {AM:23335::"partially reviewed"} with PT at this time.  Of note for psychotherapy at this time are hx of prostate CA and prostatectomy not volunteered by PT in interview, discrepant understandings of vitamin deficiencies, and abiding reluctance to take on medication. Past Medical History:  Diagnosis Date   Cancer [facility])    prostate     Past Surgical History:  Procedure Laterality Date   NO PAST SURGERIES     ROBOT ASSISTED LAPAROSCOPIC RADICAL PROSTATECTOMY N/A 10/17/2016   Procedure: ROBOTIC ASSISTED LAPAROSCOPIC RADICAL PROSTATECTOMY LEVEL 2/ BILATERAL PELVIC LYMPHADENECTOMY;  Surgeon: Raynelle Bring, MD;  Location: [facility];  Service: Urology;  Laterality: N/A;   WRIST SURGERY     skiing accident when 61 years old    Allergies  Allergen Reactions   Prednisone Rash    Medications (as listed in Epic): Verified Ablify 10mg  QAM, 0.5mg  lorazepam QHS, and mirtazapine.  B-12 injection.  No longer taking B-complex, melatonin, or Restoril.   Current Outpatient Medications  Medication Sig Dispense Refill   ARIPiprazole (ABILIFY) 10 MG tablet Take 1 tablet (10 mg total) by mouth daily. Psychosis 30 tablet 0   B Complex-C (B-COMPLEX WITH VITAMIN C) tablet Take 1 tablet by mouth daily. For nutritional supplementation 30 tablet 0   LORazepam  (ATIVAN) 0.5 MG tablet Take 1 tablet (0.5 mg total) by mouth every 4 (four) hours as needed for anxiety. 28 tablet 0   melatonin 5 MG TABS Take 1 tablet (5 mg total) by mouth at bedtime. For sleep 30 tablet 0   mirtazapine (REMERON) 15 MG tablet Take 1 tablet (15 mg total) by mouth at bedtime. For depression/sleep 30 tablet 0   temazepam (RESTORIL) 15 MG capsule Take 1 capsule (15 mg total) by mouth at bedtime as needed for sleep. 7 capsule 0   Vitamin D, Ergocalciferol, (DRISDOL) 1.25 MG (50000 UNIT) CAPS capsule Take 1 capsule (50,000 Units total) by mouth every 7 (seven) days. For bone health 7 capsule 0   No current facility-administered medications for this visit.    MENTAL STATUS AND OBSERVATIONS Appearance:   {PSY:22683}     Behavior:  {PSY:21022743}  Motor:  {PSY:22302}  Speech/Language:   {PSY:22685}  Affect:  {PSY:22687}  Mood:  {PSY:31886}  Thought process:  {PSY:31888}  Thought content:    {[phone]}  Sensory/Perceptual disturbances:    {PSY:[phone]}  Orientation:  {AM:23301::"Fully oriented"}  Attention:  {PSY:23770::"Good"}  Concentration:  {PSY:23770::"Good"}  Memory:  {GYI:9485462703}  Fund of knowledge:   {PSY:23770::"Good"}  Insight:    {PSY:23770::"Good"}  Judgment:   {PSY:23770::"Good"}  Impulse Control:  {PSY:23770::"Good"}   Initial Risk Assessment: Danger to self: {Risk:22599::"No"} Self-injurious behavior: {Risk:22599::"No"} Danger to others: {Risk:22599::"No"} Physical aggression / violence: {Risk:22599::"No"} Duty to warn: {AMYesNo:22526::"No"} Access to firearms a concern: {AMYesNo:22526::"No"} Gang involvement: {AMYesNo:22526::"No"} Patient / guardian was educated about steps to take if suicide or homicide risk level increases between visits: {Yes-No-NA-wild:22598::"yes"} While future psychiatric events cannot be accurately predicted, the patient does  not currently require acute inpatient psychiatric care and does not currently meet [facility] involuntary commitment criteria.   DIAGNOSIS:    ICD-10-CM   1. MDD (major depressive disorder), single episode, severe with psychosis ([place])  F32.3     2. Anxiety disorder, unspecified type  F41.9       INITIAL TREATMENT: Support/validation provided for distressing symptoms and confirmed rapport Ethical orientation and informed consent confirmed re: privacy rights -- including but not limited to HIPAA, EMR and use of e-PHI patient responsibilities -- scheduling, fair notice of changes, in-person vs. telehealth and regulatory and financial conditions affecting choice expectations for working relationship in psychotherapy needs and consents for working partnerships and exchange of information with other health care providers, especially any medication and other behavioral health providers Initial orientation to cognitive-behavioral and solution-focused therapy approach Psychoeducation and initial recommendations: Educated on value of vitamin supplementation, priority on B complex and D, challenged and reframed vitamin supplementation as restoring what we all need, not an artificial or faithless  Short term plan -- 2 hrs/day starting this coming Tuesday.  Wants to get in touch with computer functions, willing to ask help/tutoring.   *** Outlook for therapy -- scheduling constraints, availability of crisis service, inclusion of family member(s) as appropriate  Plan: Biochemically Verify vitamin D is an issue to treat, supplement as directed Seek PCP's blessing to restore B-complex, not just B12.  Possible need to check MTHFR May want to include omega 3 supplement for brain health as well -- defer for now Behaviorally  2-hr daily trials of the workplace as planned.  Tasks are negotiable, workers in the office are familiar and willing to mentor/guide learning phone system, computer, software, other processes as demanded by his role.  Edie plans to go with him to the office, stand by  or assist as needed but OK to fade to background. Daily goal to "stretch" somehow -- get out for something,  Success log -- find 3 things a day I stretched to do, even if it wasn't comfortable Perspective -- don't make it about "trying" because that intensifies anxiety, break down pieces of "normal" as small as needed, give self absolute permission to "limp" rather than expect fluency and performance, just show up and work with the feelings and cognitive facility that exist today, and extra credit for engaging something despite feeling uncomfortable ROI Donneta Romberg, MD, [place] -- coordination of care Milinda Hirschfeld, [place] -- continuity of care May need for PCP, Lavone Orn, MD Maintain medication as prescribed and work faithfully with relevant prescriber(s) if any changes are desired or seem indicated Call the clinic on-call service, present to ER, or call 911 if any life-threatening psychiatric crisis Return q 2 wks as able.  Currently scheduled f/u 1 week, keep that.  Blanchie Serve, PhD  Luan Moore, PhD LP Clinical Psychologist, [facility] [address] [place], [place] [address] [phone]

## 2021-07-11 DIAGNOSIS — C44321 Squamous cell carcinoma of skin of nose: Secondary | ICD-10-CM | POA: Diagnosis not present

## 2021-07-11 DIAGNOSIS — Z85828 Personal history of other malignant neoplasm of skin: Secondary | ICD-10-CM | POA: Diagnosis not present

## 2021-07-11 DIAGNOSIS — L821 Other seborrheic keratosis: Secondary | ICD-10-CM | POA: Diagnosis not present

## 2021-07-13 ENCOUNTER — Other Ambulatory Visit: Payer: Self-pay

## 2021-07-13 ENCOUNTER — Ambulatory Visit (INDEPENDENT_AMBULATORY_CARE_PROVIDER_SITE_OTHER): Payer: BC Managed Care – PPO | Admitting: Psychiatry

## 2021-07-13 DIAGNOSIS — F323 Major depressive disorder, single episode, severe with psychotic features: Secondary | ICD-10-CM | POA: Diagnosis not present

## 2021-07-13 DIAGNOSIS — F419 Anxiety disorder, unspecified: Secondary | ICD-10-CM | POA: Diagnosis not present

## 2021-07-13 NOTE — Progress Notes (Unsigned)
Psychotherapy Progress Note [facility] Luan Moore, PhD LP  Patient ID: Joshua Lloyd)    MRN: [id number] Therapy format: {Therapy Types:21967::"Individual psychotherapy"} Date: 07/13/2021      Start: ***:***     Stop: ***:***     Time Spent: *** min Location: {SvcLoc:22530::"In-person"}   Session narrative (presenting needs, interim history, self-report of stressors and symptoms, applications of prior therapy, status changes, and interventions made in session) Has been back to the office this week, starting with 2 hrs intended Tues but managed half-day Tues, Wed, Thurs, and today 3/4.  Not fatigued, but   Drove over alone today, to his credit.  Only needed Edie to go over with him first day, independent after that.  Therapeutic modalities: {AM:23362::"Cognitive Behavioral Therapy","Solution-Oriented/Positive Psychology"}  Mental Status/Observations:  Appearance:   {PSY:22683}     Behavior:  {PSY:21022743}  Motor:  {PSY:22302}  Speech/Language:   {PSY:22685}  Affect:  {PSY:22687}  Mood:  {PSY:31886}  Thought process:  {PSY:31888}  Thought content:    {PSY:220-836-3256}  Sensory/Perceptual disturbances:    {PSY:762-021-6681}  Orientation:  {Psych Orientation:23301::"Fully oriented"}  Attention:  {Good-Fair-Poor ratings:23770::"Good"}    Concentration:  {Good-Fair-Poor ratings:23770::"Good"}  Memory:  {[phone]}  Insight:    {Good-Fair-Poor ratings:23770::"Good"}  Judgment:   {Good-Fair-Poor ratings:23770::"Good"}  Impulse Control:  {Good-Fair-Poor ratings:23770::"Good"}   Risk Assessment: Danger to Self: {Risk:22599::"No"} Self-injurious Behavior: {Risk:22599::"No"} Danger to Others: {Risk:22599::"No"} Physical Aggression / Violence: {Risk:22599::"No"} Duty to Warn: {AMYesNo:22526::"No"} Access to Firearms a concern: {AMYesNo:22526::"No"}  Assessment of progress:  {Progress:22147::"progressing"}  Diagnosis:   ICD-10-CM   1. MDD (major  depressive disorder), single episode, severe with psychosis ([place])  F32.3      Plan:  *** Other recommendations/advice as may be noted above Continue to utilize previously learned skills ad lib Maintain medication as prescribed and work faithfully with relevant prescriber(s) if any changes are desired or seem indicated Call the clinic on-call service, 988/hotline, 911, or present to [facility] or ER if any life-threatening psychiatric crisis No follow-ups on file. Already scheduled visit in this office 08/01/2021.  Blanchie Serve, PhD Luan Moore, PhD LP Clinical Psychologist, [facility] [address] [place], [place] [address] [phone]

## 2021-07-13 NOTE — Patient Instructions (Addendum)
Ideas for getting more comfortable with tasks in the office:  (1) Phone  A. Look up Time and Temperature and dial that number 10 times. See if it feels routine or boring by the time you're done. B. Make 10 calls to a personal number C. Make 5 calls to a long-distance number   (2) Looking up numbers in client database  A. Get office staff to show you how to log in into [employer], then log in with supervision/standby.  Repeat 3-10 times.  B. Watch staff look up client information, then do it yourself with supervision.  Repeat 3-10 times, until satisfied, bored, or tired.  (3) Making business calls A. Sit in on someone else's business call  B. Sit in on a business call and say hello, put one thought into the conversation C. Sit in and ask one meaningful business question  (4) Video conferencing  A. Watch someone start Zoom  B. Work the steps of starting a video with standby assistance  C. Repeat 3-10 times to satisfaction.  (5) For learning policy and procedure information on screen  A. Set an amount of time or material to read, break it into pieces.  B. Every so often, write a crib note for the part you just read (paper is OK)  C. After a while, look back at your own notes -- do they make sense?  D. If you find something missing or doesn't make sense, re-read that part and edit your crib note

## 2021-07-24 DIAGNOSIS — D510 Vitamin B12 deficiency anemia due to intrinsic factor deficiency: Secondary | ICD-10-CM | POA: Diagnosis not present

## 2021-07-24 DIAGNOSIS — F322 Major depressive disorder, single episode, severe without psychotic features: Secondary | ICD-10-CM | POA: Diagnosis not present

## 2021-07-24 DIAGNOSIS — F419 Anxiety disorder, unspecified: Secondary | ICD-10-CM | POA: Diagnosis not present

## 2021-08-01 ENCOUNTER — Ambulatory Visit: Payer: BC Managed Care – PPO | Admitting: Psychiatry

## 2021-08-14 DIAGNOSIS — C44321 Squamous cell carcinoma of skin of nose: Secondary | ICD-10-CM | POA: Diagnosis not present

## 2021-08-15 DIAGNOSIS — F251 Schizoaffective disorder, depressive type: Secondary | ICD-10-CM | POA: Diagnosis not present

## 2021-08-21 ENCOUNTER — Other Ambulatory Visit: Payer: Self-pay

## 2021-08-21 ENCOUNTER — Ambulatory Visit (INDEPENDENT_AMBULATORY_CARE_PROVIDER_SITE_OTHER): Payer: BC Managed Care – PPO | Admitting: Psychiatry

## 2021-08-21 DIAGNOSIS — F323 Major depressive disorder, single episode, severe with psychotic features: Secondary | ICD-10-CM | POA: Diagnosis not present

## 2021-08-21 DIAGNOSIS — F419 Anxiety disorder, unspecified: Secondary | ICD-10-CM

## 2021-08-21 NOTE — Progress Notes (Incomplete)
Psychotherapy Progress Note [facility] Luan Moore, PhD LP  Patient ID: Joshua Lloyd)    MRN: [id number] Therapy format: Family therapy w/ patient -- accompanied by Mortimer Fries Date: 08/21/2021      Start: 3:20p     Stop: ***:***     Time Spent: *** min Location: In-person   Session narrative (presenting needs, interim history, self-report of stressors and symptoms, applications of prior therapy, status changes, and interventions made in session) Since last seen, has been going to office regularly, but both PCP and psychiatry are encouraging him to go 1/4 time.  Acknowledges he   Wife says she's still apprehensive about going to the office, still skittish about driving anywhere.   Dr. Casimiro Needle changed meds last week -- Abilify from 10mg  to 5mg , Remeron increased from 15mg  to 30mg .  Edie sees maybe some more agitation.  Vastly improved from September.  Typically goes to bed early (9pm, 10:30 used to be normal), will be fitful through the night   Reviewed sleep habits.  Not so much TV before bed.  Not much for exercise, but agreeable to walk 30 minutes, together.  Meals are well-regulated for time.  Denies bedtime worry.  Wakes without alarm, may urinate and go back down for 2 hours.  Edie wonders about sleep apnea, since he snores, but   At work, has not much done [employer] with Joycelyn Schmid.  Maybe twice, one to [place] for a client visit,   Therapeutic modalities: {AM:23362::"Cognitive Behavioral Therapy","Solution-Oriented/Positive Psychology"}  Mental Status/Observations:  Appearance:   {PSY:22683}     Behavior:  {PSY:21022743}  Motor:  {PSY:22302}  Speech/Language:   {PSY:22685}  Affect:  {PSY:22687}  Mood:  {PSY:31886}  Thought process:  {PSY:31888}  Thought content:    {PSY:[phone]}  Sensory/Perceptual disturbances:    {PSY:934-828-2527}  Orientation:  {Psych Orientation:23301::"Fully oriented"}  Attention:  {Good-Fair-Poor ratings:23770::"Good"}     Concentration:  {Good-Fair-Poor ratings:23770::"Good"}  Memory:  {PSY:215-583-2847}  Insight:    {Good-Fair-Poor ratings:23770::"Good"}  Judgment:   {Good-Fair-Poor ratings:23770::"Good"}  Impulse Control:  {Good-Fair-Poor ratings:23770::"Good"}   Risk Assessment: Danger to Self: {Risk:22599::"No"} Self-injurious Behavior: {Risk:22599::"No"} Danger to Others: {Risk:22599::"No"} Physical Aggression / Violence: {Risk:22599::"No"} Duty to Warn: {AMYesNo:22526::"No"} Access to Firearms a concern: {AMYesNo:22526::"No"}  Assessment of progress:  {Progress:22147::"progressing"}  Diagnosis: No diagnosis found. Plan:  *** Endorse part days right now Commitment to half hour walking per day, generally together with Edie Request from [place] to look for opportunities to do ridealongs with familiar clients Request tutoring time from coworkers in [employer].   Make some brief phone calls, even if they're personal, e.g, family or friends Still recommend vitamin D Other recommendations/advice as may be noted above Continue to utilize previously learned skills ad lib Maintain medication as prescribed and work faithfully with relevant prescriber(s) if any changes are desired or seem indicated Call the clinic on-call service, 988/hotline, 911, or present to [facility] [place] or ER if any life-threatening psychiatric crisis Return in about 2 weeks (around 09/04/2021) for recommend scheduling ahead. Already scheduled visit in this office 09/04/2021.  Blanchie Serve, PhD Luan Moore, PhD LP Clinical Psychologist, [facility] [address] [place], [place] [address] [phone]

## 2021-09-04 ENCOUNTER — Other Ambulatory Visit: Payer: Self-pay

## 2021-09-04 ENCOUNTER — Ambulatory Visit (INDEPENDENT_AMBULATORY_CARE_PROVIDER_SITE_OTHER): Payer: BC Managed Care – PPO | Admitting: Psychiatry

## 2021-09-04 DIAGNOSIS — F419 Anxiety disorder, unspecified: Secondary | ICD-10-CM | POA: Diagnosis not present

## 2021-09-04 DIAGNOSIS — F323 Major depressive disorder, single episode, severe with psychotic features: Secondary | ICD-10-CM | POA: Diagnosis not present

## 2021-09-04 NOTE — Progress Notes (Signed)
Psychotherapy Progress Note ?[facility] ?Luan Moore, PhD LP ? ?Patient ID: Joshua Lloyd)    MRN: [id number] ?Therapy format: Individual psychotherapy ?Date: 09/04/2021      Start: 3:04p     Stop: 3:52p     Time Spent: 48 min ?Location: In-person  ? ?Session narrative (presenting needs, interim history, self-report of stressors and symptoms, applications of prior therapy, status changes, and interventions made in session) ?Past 2 weeks has been going in half-time, feels OK with it.  Working on emails, feels fluent enough, no confusion, just may need to brush up on technical matters.  With staff, has had some practice saying "I don't know, let me find out", albeit uncomfortable, some times not able to admit, but mostly able to admit.  Has had to refamiliarize with policies and provisions, sometimes can ask a colleague, with 5/10 discomfort.  Affirms no one is treating him as inconvenient.  Current office just moved to large building on [place].  Sometime in April, he and the remaining two will move over.  Joycelyn Schmid moved over already.   No ridealongs available right now, but would want one if it came available.  Anxiety can hit hard some moments, and he will pace to cope with it.   ? ?Taught PMR and breath counting in session.  Despite early restlessness and leg shaking, was able to calm down, report sincere calming effect, even to feeling sleepy (perhaps to his embarrassment).   ? ?Have been able to do some couples walking with W Edie.  Have decided to table sleep apnea concern.  Has a neurology consultation coming.   ? ?Therapeutic modalities: Cognitive Behavioral Therapy and Solution-Oriented/Positive Psychology ? ?Mental Status/Observations: ? ?Appearance:   Casual and Neat     ?Behavior:  Appropriate  ?Motor:  Normal and less leg bouncing  ?Speech/Language:   Clear and Coherent  ?Affect:  Constricted  ?Mood:  anxious  ?Thought process:  normal  ?Thought content:    worry   ?Sensory/Perceptual disturbances:    WNL, r/o hearing deficit  ?Orientation:  Fully oriented  ?Attention:  Good  ?  ?Concentration:  Fair  ?Memory:  WNL  ?Insight:    Fair  ?Judgment:   Good  ?Impulse Control:  Good  ? ?Risk Assessment: ?Danger to Self: No Self-injurious Behavior: No ?Danger to Others: No Physical Aggression / Violence: No ?Duty to Warn: No Access to Firearms a concern: No ? ?Assessment of progress:  progressing ? ?Diagnosis: ?  ICD-10-CM   ?1. MDD (major depressive disorder), single episode, severe with psychosis ([place])  F32.3   ?  ?2. Anxiety disorder, unspecified type  F41.9   ?  ? ?Plan:  ?PMR and breath counting 1-2 times a day.  OK to use before sleep for sleep readiness. Share with Edie. ?Endorse 1/4 days right now, no more than half time ?Commitment to half hour walking per day, generally together with Edie  ?If need to walk for tension release during work day, consider going outside ?If desired, ask Joycelyn Schmid for another client ridealong ?Request tutoring time from coworkers in [employer].   ?Make some brief phone calls, even if they're personal, e.g, family or friends, to get fluent with phone  ?Continue walking with Edie  ?Still recommend vitamin D, in addition to B complex, for brain health and recovery ?Try short drives, around the block, parking lot, whatever will be low threat ?Consider sleep apnea home assessment ?Other recommendations/advice as may be noted above ?Continue  to utilize previously learned skills ad lib ?Maintain medication as prescribed and work faithfully with relevant prescriber(s) if any changes are desired or seem indicated ?Call the clinic on-call service, 988/hotline, 911, or present to [facility] or ER if any life-threatening psychiatric crisis ?Return in about 2 weeks (around 09/18/2021) for time as available, available earlier @ PT's need. ?Already scheduled visit in this office 10/02/2021. ? ?Blanchie Serve, PhD ?Luan Moore, PhD LP ?Clinical Psychologist,  [facility] ?[facility] ?[address], [address] ?[place], [place] [address] ?(o) [phone] ?

## 2021-09-05 DIAGNOSIS — F324 Major depressive disorder, single episode, in partial remission: Secondary | ICD-10-CM | POA: Diagnosis not present

## 2021-09-05 DIAGNOSIS — F419 Anxiety disorder, unspecified: Secondary | ICD-10-CM | POA: Diagnosis not present

## 2021-09-05 DIAGNOSIS — E538 Deficiency of other specified B group vitamins: Secondary | ICD-10-CM | POA: Diagnosis not present

## 2021-10-02 ENCOUNTER — Ambulatory Visit (INDEPENDENT_AMBULATORY_CARE_PROVIDER_SITE_OTHER): Payer: BC Managed Care – PPO | Admitting: Psychiatry

## 2021-10-02 DIAGNOSIS — F323 Major depressive disorder, single episode, severe with psychotic features: Secondary | ICD-10-CM

## 2021-10-02 DIAGNOSIS — F419 Anxiety disorder, unspecified: Secondary | ICD-10-CM | POA: Diagnosis not present

## 2021-10-02 NOTE — Progress Notes (Signed)
Psychotherapy Progress Note ?[facility] ?Luan Moore, PhD LP ? ?Patient ID: Joshua Lloyd)    MRN: [id number] ?Therapy format: Individual psychotherapy ?Date: 10/02/2021      Start: 4:20p     Stop: 5:00p     Time Spent: 40 min ?Location: In-person  ? ?Session narrative (presenting needs, interim history, self-report of stressors and symptoms, applications of prior therapy, status changes, and interventions made in session) ?"Pretty well."  Getting out of the house more, working almost full days now.  Getting through emails is clearer.  Logging into [employer] software reliably, not using it that often, but enough used to it for it to be less daunting.  Still would need help with some functions but hasn't asked.  Tomorrow 7-8/10 anxiety about getting a password changed, has projects tomorrow for signing up for [employer].  Does feel he has been sticking with familiar things more, not so much branching out, e.g., to make business calls.  No noted in-person business calls or ridealongs. ? 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  One incident serving a little after looking away, lost his line.  Routinely keeping the same route to/from work, sounds like out of fear of getting lost.  Discussed the brain-stimulating value of varying his route and creating some small stimuli for alertness and navigation.   ? ?Appetite still inhibited.  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  Not taking any vitamin supplements at this point but says he does get regular B12 shot, roughly Q 6-8 wks now, based on blood testing.  Discussed the enduring value of B vitamins as a whole for brain recovery and neuro function, recommended resuming B complex, if not for necessity, for a boost to recovery.  Pointed out that OTC brain booster formulas are typically made up of B complex with a few additions, and what is known to help those customers  is likely to help him. ? ?Encouraged in asking for help, and in conceptualizing his recovery as equivalent to a concussion.  Framed challenge to pick out two customers to make checkin calls with to help stimulate sense of normalcy and provoke his brain to remake connections related to his work.  Briefly played out the process of a call, working through apprehension about what he would say if asked why it's been a long time. ? ?Therapeutic modalities: Cognitive Behavioral Therapy, Solution-Oriented/Positive Psychology, and Psycho-education/Bibliotherapy ? ?Mental Status/Observations: ? ?Appearance:   Casual and Neat     ?Behavior:  Appropriate  ?Motor:  Restlestness and leg shaking  ?Speech/Language:   clear  ?Affect:  Constricted and somewhat wide-eyed, responsive to humor  ?Mood:  anxious  ?Thought process:  normal and some inhibited awareness  ?Thought content:    WNL  ?Sensory/Perceptual disturbances:    WNL  ?Orientation:  Fully oriented  ?Attention:  Good  ?  ?Concentration:  Fair  ?Memory:  grossly intact  ?Insight:    Fair  ?Judgment:   Good  ?Impulse Control:  Good  ? ?Risk Assessment: ?Danger to Self: No Self-injurious Behavior: No ?Danger to Others: No Physical Aggression / Violence: No ?Duty to Warn: No Access to Firearms a concern: No ? ?Assessment of progress:  progressing ? ?Diagnosis: ?  ICD-10-CM   ?1. MDD (major depressive disorder), single episode, severe with psychosis ([place])  F32.3   ? moderate, suspect micro damage from psychotic episode in gradual recovery  ?  ?  2. Anxiety disorder, unspecified type  F41.9   ?  ? ?Plan:  ?Reframe his recovery as equivalent to a concussion, to grant himself more absolute permission to ask for help, tutoring in office skills when needed. ?Encourage to make two business calls soon to re-expand his repertoire of business behavior.  Agreed two most comfortable subjects, [place] and [place], and advocated being willing to say he was out for a health condition but is  back now, felt it was time to check back in.  Fully optional whether to go into what happened to him or defer. ?Continue driving, as regularly as possible, to work up fluency and courage behind the wheel.  When ready, option to start varying his route between home and work, building in "mini joy rides" for curiosity and to stimulate awareness and navigation faculties. ?Still recommend B complex, even if getting B12 injections.  Clear with physician if needed, but the extra B vitamin support is what is in various OTC brain supplements, so should be appropriate for improved recovery.  If vitamin D is still needed or tests low, by all means resume supplement, too.  Don't fear vitamin supplements as wrong or somehow "against" medication, just more of a healthy thing at  time when more could be extra helpful. ?Other recommendations/advice as may be noted above ?Continue to utilize previously learned skills ad lib ?Maintain medication as prescribed and work faithfully with relevant prescriber(s) if any changes are desired or seem indicated ?Call the clinic on-call service, 988/hotline, 911, or present to [facility] or ER if any life-threatening psychiatric crisis ?Return in about 2 weeks (around 10/16/2021) for recommend scheduling ahead, will call. ?Already scheduled visit in this office 10/17/2021. ? ?Blanchie Serve, PhD ?Luan Moore, PhD LP ?Clinical Psychologist, [facility] ?[facility] ?[address] ?[place], [place] [address] ?(o) [phone] ?

## 2021-10-10 DIAGNOSIS — F4323 Adjustment disorder with mixed anxiety and depressed mood: Secondary | ICD-10-CM | POA: Diagnosis not present

## 2021-10-11 DIAGNOSIS — F251 Schizoaffective disorder, depressive type: Secondary | ICD-10-CM | POA: Diagnosis not present

## 2021-10-16 DIAGNOSIS — F419 Anxiety disorder, unspecified: Secondary | ICD-10-CM | POA: Diagnosis not present

## 2021-10-16 DIAGNOSIS — D510 Vitamin B12 deficiency anemia due to intrinsic factor deficiency: Secondary | ICD-10-CM | POA: Diagnosis not present

## 2021-10-16 DIAGNOSIS — F324 Major depressive disorder, single episode, in partial remission: Secondary | ICD-10-CM | POA: Diagnosis not present

## 2021-10-17 ENCOUNTER — Ambulatory Visit: Payer: BC Managed Care – PPO | Admitting: Psychiatry

## 2021-10-18 DIAGNOSIS — F251 Schizoaffective disorder, depressive type: Secondary | ICD-10-CM | POA: Diagnosis not present

## 2021-10-24 ENCOUNTER — Ambulatory Visit: Payer: BC Managed Care – PPO | Admitting: Psychiatry

## 2021-11-15 DIAGNOSIS — E538 Deficiency of other specified B group vitamins: Secondary | ICD-10-CM | POA: Diagnosis not present

## 2021-11-27 DIAGNOSIS — G3184 Mild cognitive impairment, so stated: Secondary | ICD-10-CM | POA: Diagnosis not present

## 2021-11-27 DIAGNOSIS — F22 Delusional disorders: Secondary | ICD-10-CM | POA: Diagnosis not present

## 2021-11-27 DIAGNOSIS — R453 Demoralization and apathy: Secondary | ICD-10-CM | POA: Diagnosis not present

## 2021-11-27 DIAGNOSIS — F419 Anxiety disorder, unspecified: Secondary | ICD-10-CM | POA: Diagnosis not present

## 2021-11-27 DIAGNOSIS — F29 Unspecified psychosis not due to a substance or known physiological condition: Secondary | ICD-10-CM | POA: Diagnosis not present

## 2021-11-29 DIAGNOSIS — G4733 Obstructive sleep apnea (adult) (pediatric): Secondary | ICD-10-CM | POA: Diagnosis not present

## 2021-12-04 ENCOUNTER — Ambulatory Visit: Payer: BC Managed Care – PPO | Admitting: Psychiatry

## 2021-12-11 DIAGNOSIS — G4733 Obstructive sleep apnea (adult) (pediatric): Secondary | ICD-10-CM | POA: Diagnosis not present

## 2021-12-13 DIAGNOSIS — G3184 Mild cognitive impairment, so stated: Secondary | ICD-10-CM | POA: Diagnosis not present

## 2021-12-19 DIAGNOSIS — F324 Major depressive disorder, single episode, in partial remission: Secondary | ICD-10-CM | POA: Diagnosis not present

## 2021-12-19 DIAGNOSIS — G20 Parkinson's disease: Secondary | ICD-10-CM | POA: Diagnosis not present

## 2021-12-19 DIAGNOSIS — R41844 Frontal lobe and executive function deficit: Secondary | ICD-10-CM | POA: Diagnosis not present

## 2021-12-19 DIAGNOSIS — D510 Vitamin B12 deficiency anemia due to intrinsic factor deficiency: Secondary | ICD-10-CM | POA: Diagnosis not present

## 2021-12-19 DIAGNOSIS — G4733 Obstructive sleep apnea (adult) (pediatric): Secondary | ICD-10-CM | POA: Diagnosis not present

## 2021-12-20 DIAGNOSIS — F251 Schizoaffective disorder, depressive type: Secondary | ICD-10-CM | POA: Diagnosis not present

## 2022-01-10 DIAGNOSIS — G4733 Obstructive sleep apnea (adult) (pediatric): Secondary | ICD-10-CM | POA: Diagnosis not present

## 2022-01-21 DIAGNOSIS — F251 Schizoaffective disorder, depressive type: Secondary | ICD-10-CM | POA: Diagnosis not present

## 2022-01-22 DIAGNOSIS — D510 Vitamin B12 deficiency anemia due to intrinsic factor deficiency: Secondary | ICD-10-CM | POA: Diagnosis not present

## 2022-02-28 DIAGNOSIS — R453 Demoralization and apathy: Secondary | ICD-10-CM | POA: Diagnosis not present

## 2022-02-28 DIAGNOSIS — G20 Parkinson's disease: Secondary | ICD-10-CM | POA: Diagnosis not present

## 2022-02-28 DIAGNOSIS — G3184 Mild cognitive impairment, so stated: Secondary | ICD-10-CM | POA: Diagnosis not present

## 2022-03-05 DIAGNOSIS — E538 Deficiency of other specified B group vitamins: Secondary | ICD-10-CM | POA: Diagnosis not present

## 2022-03-05 DIAGNOSIS — G4733 Obstructive sleep apnea (adult) (pediatric): Secondary | ICD-10-CM | POA: Diagnosis not present

## 2022-03-05 DIAGNOSIS — F322 Major depressive disorder, single episode, severe without psychotic features: Secondary | ICD-10-CM | POA: Diagnosis not present

## 2022-03-05 DIAGNOSIS — Z8546 Personal history of malignant neoplasm of prostate: Secondary | ICD-10-CM | POA: Diagnosis not present

## 2022-03-06 DIAGNOSIS — G4733 Obstructive sleep apnea (adult) (pediatric): Secondary | ICD-10-CM | POA: Diagnosis not present

## 2022-03-13 DIAGNOSIS — G4733 Obstructive sleep apnea (adult) (pediatric): Secondary | ICD-10-CM | POA: Diagnosis not present

## 2022-03-20 DIAGNOSIS — F251 Schizoaffective disorder, depressive type: Secondary | ICD-10-CM | POA: Diagnosis not present

## 2022-04-04 DIAGNOSIS — E538 Deficiency of other specified B group vitamins: Secondary | ICD-10-CM | POA: Diagnosis not present

## 2022-04-09 DIAGNOSIS — G20C Parkinsonism, unspecified: Secondary | ICD-10-CM | POA: Diagnosis not present

## 2022-05-22 DIAGNOSIS — F251 Schizoaffective disorder, depressive type: Secondary | ICD-10-CM | POA: Diagnosis not present

## 2022-07-03 DIAGNOSIS — F419 Anxiety disorder, unspecified: Secondary | ICD-10-CM | POA: Diagnosis not present

## 2022-07-03 DIAGNOSIS — F322 Major depressive disorder, single episode, severe without psychotic features: Secondary | ICD-10-CM | POA: Diagnosis not present

## 2022-07-03 DIAGNOSIS — Z79899 Other long term (current) drug therapy: Secondary | ICD-10-CM | POA: Diagnosis not present

## 2022-07-03 DIAGNOSIS — Z8546 Personal history of malignant neoplasm of prostate: Secondary | ICD-10-CM | POA: Diagnosis not present

## 2022-07-03 DIAGNOSIS — D510 Vitamin B12 deficiency anemia due to intrinsic factor deficiency: Secondary | ICD-10-CM | POA: Diagnosis not present

## 2022-07-24 DIAGNOSIS — F251 Schizoaffective disorder, depressive type: Secondary | ICD-10-CM | POA: Diagnosis not present

## 2022-07-26 DIAGNOSIS — K648 Other hemorrhoids: Secondary | ICD-10-CM | POA: Diagnosis not present

## 2022-07-26 DIAGNOSIS — K625 Hemorrhage of anus and rectum: Secondary | ICD-10-CM | POA: Diagnosis not present

## 2022-09-03 DIAGNOSIS — K635 Polyp of colon: Secondary | ICD-10-CM | POA: Diagnosis not present

## 2022-09-03 DIAGNOSIS — Z86010 Personal history of colonic polyps: Secondary | ICD-10-CM | POA: Diagnosis not present

## 2022-09-03 DIAGNOSIS — K625 Hemorrhage of anus and rectum: Secondary | ICD-10-CM | POA: Diagnosis not present

## 2022-09-03 DIAGNOSIS — K5730 Diverticulosis of large intestine without perforation or abscess without bleeding: Secondary | ICD-10-CM | POA: Diagnosis not present

## 2022-09-03 DIAGNOSIS — K648 Other hemorrhoids: Secondary | ICD-10-CM | POA: Diagnosis not present

## 2022-11-06 DIAGNOSIS — F322 Major depressive disorder, single episode, severe without psychotic features: Secondary | ICD-10-CM | POA: Diagnosis not present

## 2022-11-06 DIAGNOSIS — F419 Anxiety disorder, unspecified: Secondary | ICD-10-CM | POA: Diagnosis not present

## 2022-11-06 DIAGNOSIS — D510 Vitamin B12 deficiency anemia due to intrinsic factor deficiency: Secondary | ICD-10-CM | POA: Diagnosis not present

## 2022-11-06 DIAGNOSIS — Z8546 Personal history of malignant neoplasm of prostate: Secondary | ICD-10-CM | POA: Diagnosis not present

## 2022-11-06 IMAGING — CT CT HEAD W/O CM
4 series · 17 of 47 positions shown, 19 images · non-contrast
Comparison: MRI brain 03/02/2021

CLINICAL DATA: Mental status change of unknown cause

EXAM:
CT HEAD WITHOUT CONTRAST
TECHNIQUE: Contiguous axial images were obtained from the base of the skull
through the vertex without intravenous contrast.

[Series 3: head without · axial · non-contrast · 0.52mm/px · z∈[-49,+71]mm · 7 of 33 slices shown, 9 images]
[im 5/33  brain]
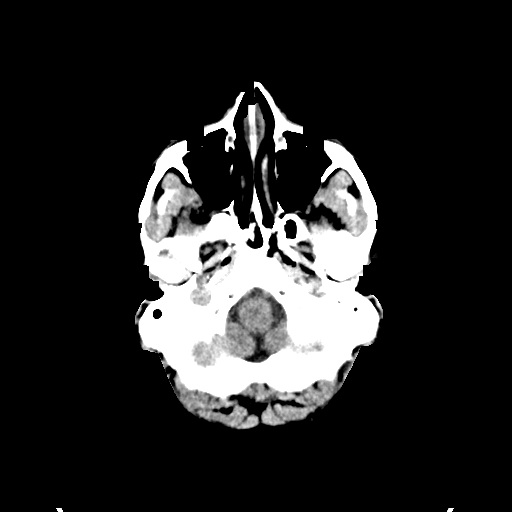
[im 5/33  bone]
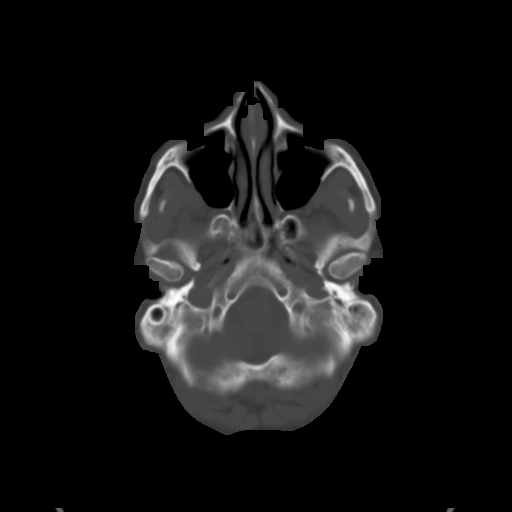
[im 9/33  brain]
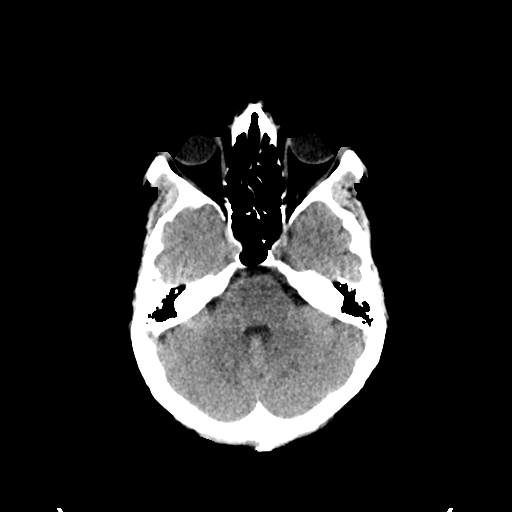
[im 13/33  brain]
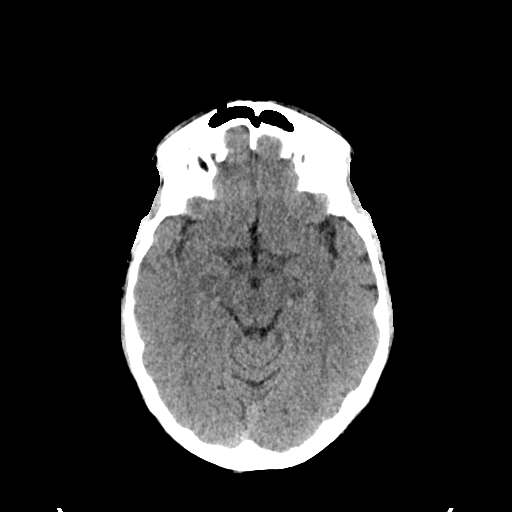
[im 17/33  brain]
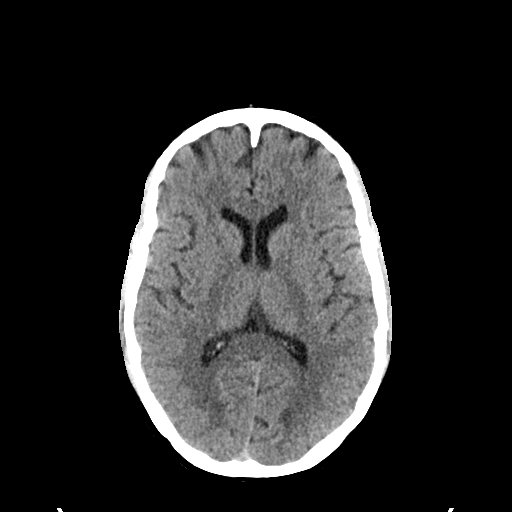
[im 21/33  brain]
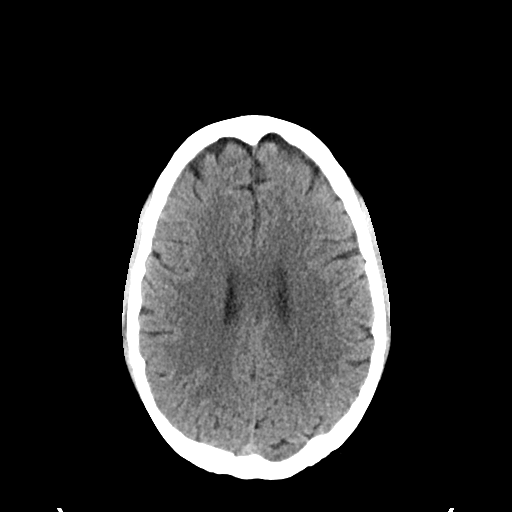
[im 21/33  bone]
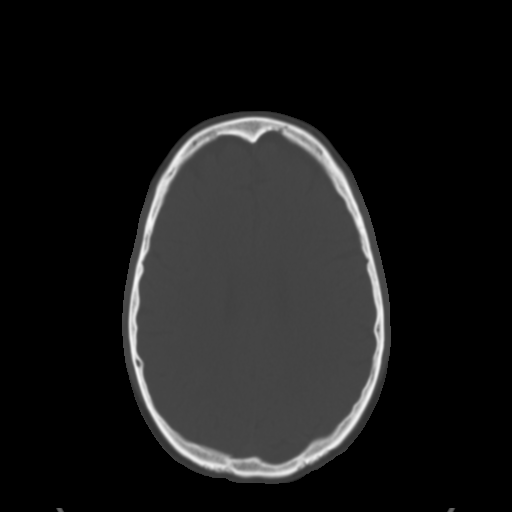
[im 25/33  brain]
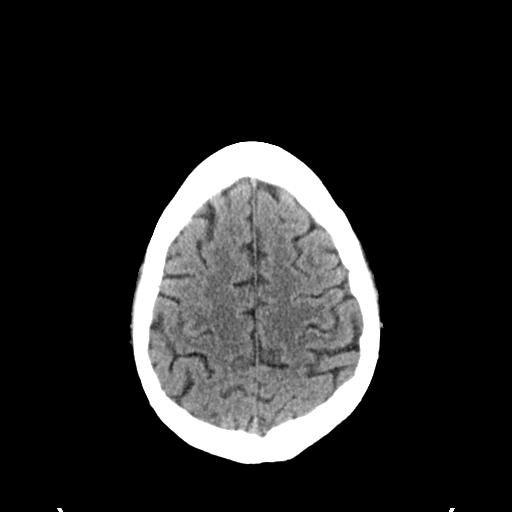
[im 29/33  brain]
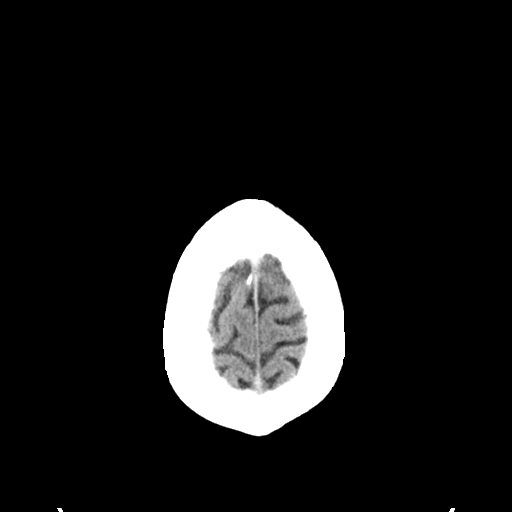

[Series 4: head bone · axial · 0.52mm/px · z∈[-53,+3]mm · 4 of 81 slices shown]
[im 9/81  bone]
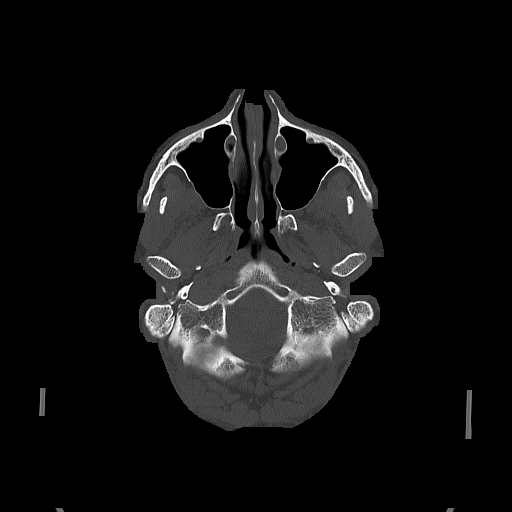
[im 17/81  bone]
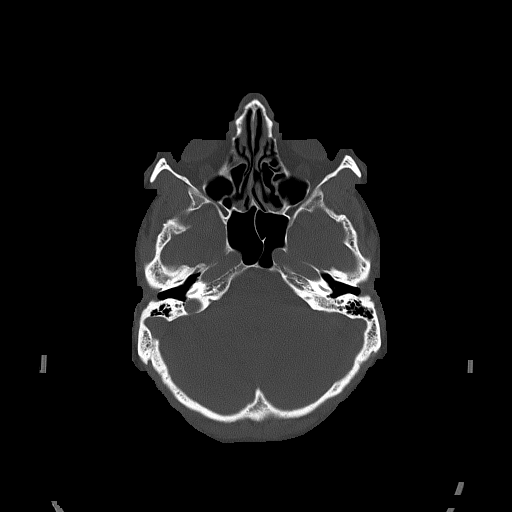
[im 25/81  bone]
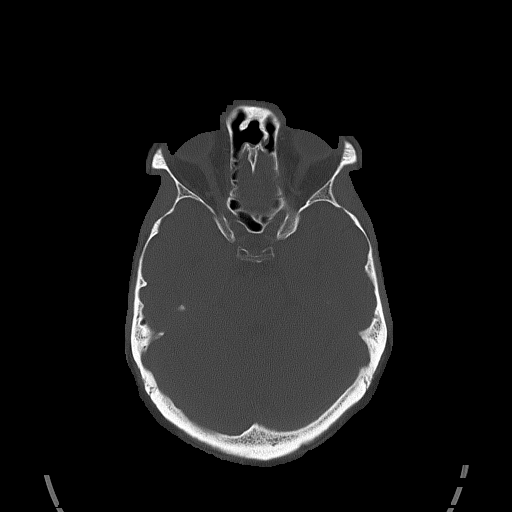
[im 37/81  bone]
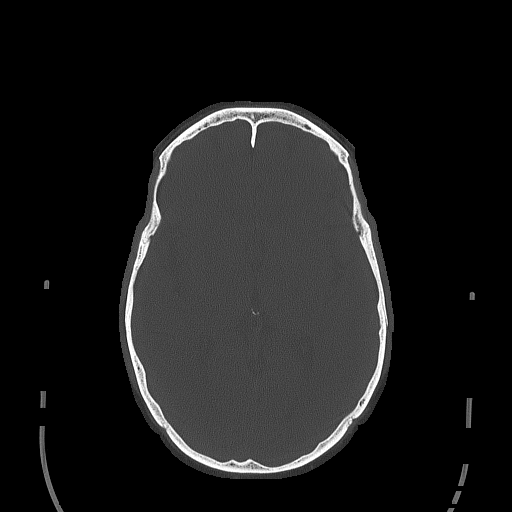

[Series 5: head without cor · coronal · non-contrast · 0.35mm/px · 3 of 64 slices shown]
[im 22/64  brain]
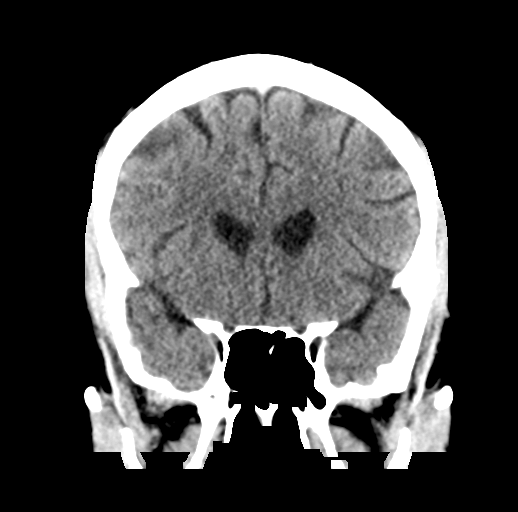
[im 29/64  brain]
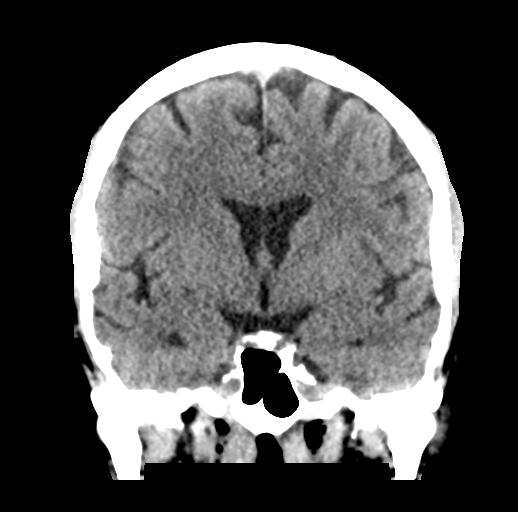
[im 36/64  brain]
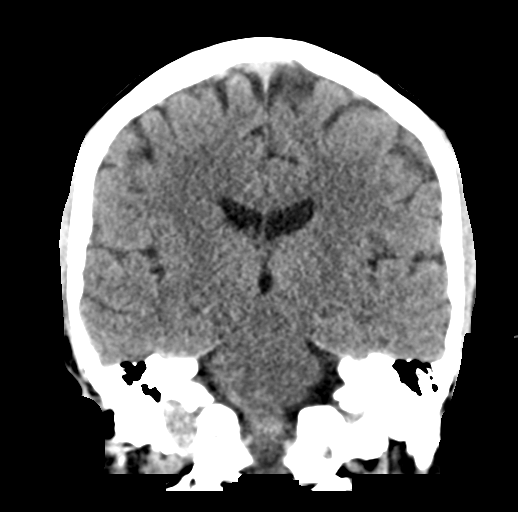

[Series 6: head without sag · sagittal · non-contrast · 0.38mm/px · 3 of 46 slices shown]
[im 16/46  brain]
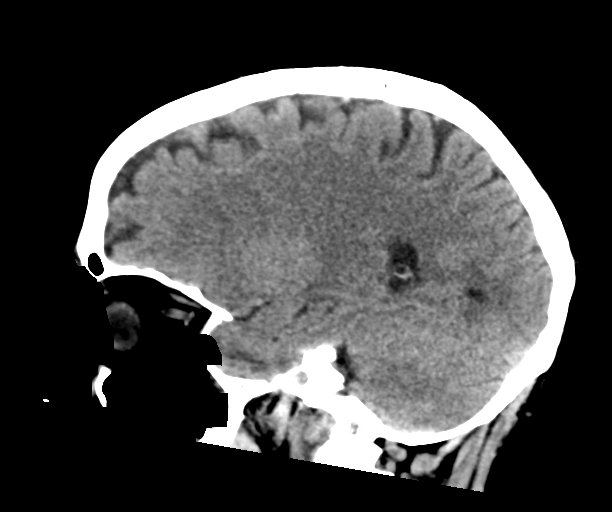
[im 23/46  brain]
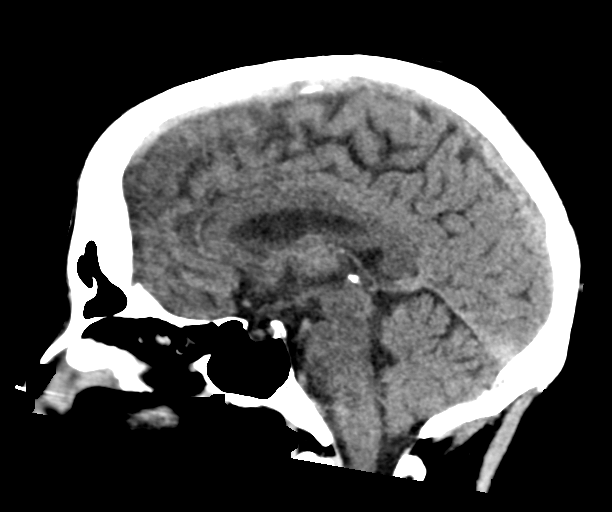
[im 31/46  brain]
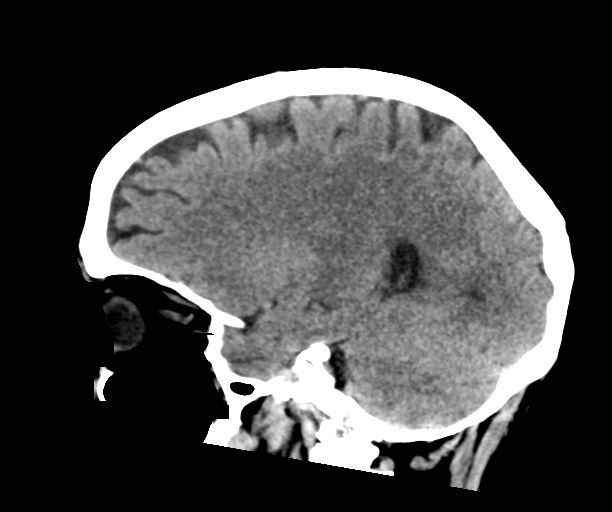

[17 of 47 positions shown; findings below may reference images not displayed]

FINDINGS: Brain: No evidence of acute infarction, hemorrhage, hydrocephalus,
extra-axial collection or mass lesion/mass effect.

Vascular: No hyperdense vessel or unexpected calcification.

Skull: Calvarium appears intact.

Sinuses/Orbits: Small retention cyst in the right maxillary antrum.
Paranasal sinuses and mastoid air cells are otherwise clear.

Other: None.
IMPRESSION: No acute intracranial abnormalities.

## 2022-11-11 DIAGNOSIS — E538 Deficiency of other specified B group vitamins: Secondary | ICD-10-CM | POA: Diagnosis not present

## 2022-11-25 DIAGNOSIS — F251 Schizoaffective disorder, depressive type: Secondary | ICD-10-CM | POA: Diagnosis not present

## 2023-01-08 DIAGNOSIS — H524 Presbyopia: Secondary | ICD-10-CM | POA: Diagnosis not present

## 2023-01-08 DIAGNOSIS — H2513 Age-related nuclear cataract, bilateral: Secondary | ICD-10-CM | POA: Diagnosis not present

## 2023-01-08 DIAGNOSIS — H01004 Unspecified blepharitis left upper eyelid: Secondary | ICD-10-CM | POA: Diagnosis not present

## 2023-01-08 DIAGNOSIS — H01001 Unspecified blepharitis right upper eyelid: Secondary | ICD-10-CM | POA: Diagnosis not present

## 2023-01-21 DIAGNOSIS — E538 Deficiency of other specified B group vitamins: Secondary | ICD-10-CM | POA: Diagnosis not present

## 2023-03-24 DIAGNOSIS — E538 Deficiency of other specified B group vitamins: Secondary | ICD-10-CM | POA: Diagnosis not present

## 2023-04-23 DIAGNOSIS — L812 Freckles: Secondary | ICD-10-CM | POA: Diagnosis not present

## 2023-04-23 DIAGNOSIS — L821 Other seborrheic keratosis: Secondary | ICD-10-CM | POA: Diagnosis not present

## 2023-04-23 DIAGNOSIS — L820 Inflamed seborrheic keratosis: Secondary | ICD-10-CM | POA: Diagnosis not present

## 2023-04-23 DIAGNOSIS — D1801 Hemangioma of skin and subcutaneous tissue: Secondary | ICD-10-CM | POA: Diagnosis not present

## 2023-04-23 DIAGNOSIS — L570 Actinic keratosis: Secondary | ICD-10-CM | POA: Diagnosis not present

## 2023-04-23 DIAGNOSIS — Z85828 Personal history of other malignant neoplasm of skin: Secondary | ICD-10-CM | POA: Diagnosis not present

## 2023-05-07 DIAGNOSIS — D510 Vitamin B12 deficiency anemia due to intrinsic factor deficiency: Secondary | ICD-10-CM | POA: Diagnosis not present

## 2023-05-07 DIAGNOSIS — F322 Major depressive disorder, single episode, severe without psychotic features: Secondary | ICD-10-CM | POA: Diagnosis not present

## 2023-05-07 DIAGNOSIS — Z0000 Encounter for general adult medical examination without abnormal findings: Secondary | ICD-10-CM | POA: Diagnosis not present

## 2023-05-07 DIAGNOSIS — Z23 Encounter for immunization: Secondary | ICD-10-CM | POA: Diagnosis not present

## 2023-05-19 DIAGNOSIS — F251 Schizoaffective disorder, depressive type: Secondary | ICD-10-CM | POA: Diagnosis not present

## 2023-06-05 DIAGNOSIS — R051 Acute cough: Secondary | ICD-10-CM | POA: Diagnosis not present

## 2023-06-05 DIAGNOSIS — Z03818 Encounter for observation for suspected exposure to other biological agents ruled out: Secondary | ICD-10-CM | POA: Diagnosis not present

## 2023-06-05 DIAGNOSIS — J069 Acute upper respiratory infection, unspecified: Secondary | ICD-10-CM | POA: Diagnosis not present

## 2023-06-26 DIAGNOSIS — C44329 Squamous cell carcinoma of skin of other parts of face: Secondary | ICD-10-CM | POA: Diagnosis not present

## 2023-07-22 DIAGNOSIS — C44329 Squamous cell carcinoma of skin of other parts of face: Secondary | ICD-10-CM | POA: Diagnosis not present

## 2023-08-11 DIAGNOSIS — E538 Deficiency of other specified B group vitamins: Secondary | ICD-10-CM | POA: Diagnosis not present

## 2023-10-02 DIAGNOSIS — L570 Actinic keratosis: Secondary | ICD-10-CM | POA: Diagnosis not present

## 2023-10-02 DIAGNOSIS — L820 Inflamed seborrheic keratosis: Secondary | ICD-10-CM | POA: Diagnosis not present

## 2023-10-13 DIAGNOSIS — E538 Deficiency of other specified B group vitamins: Secondary | ICD-10-CM | POA: Diagnosis not present

## 2023-11-11 DIAGNOSIS — L820 Inflamed seborrheic keratosis: Secondary | ICD-10-CM | POA: Diagnosis not present

## 2023-12-23 DIAGNOSIS — D510 Vitamin B12 deficiency anemia due to intrinsic factor deficiency: Secondary | ICD-10-CM | POA: Diagnosis not present

## 2024-01-13 DIAGNOSIS — D510 Vitamin B12 deficiency anemia due to intrinsic factor deficiency: Secondary | ICD-10-CM | POA: Diagnosis not present

## 2024-02-02 DIAGNOSIS — L821 Other seborrheic keratosis: Secondary | ICD-10-CM | POA: Diagnosis not present

## 2024-02-02 DIAGNOSIS — L570 Actinic keratosis: Secondary | ICD-10-CM | POA: Diagnosis not present

## 2024-02-02 DIAGNOSIS — Z85828 Personal history of other malignant neoplasm of skin: Secondary | ICD-10-CM | POA: Diagnosis not present

## 2024-02-02 DIAGNOSIS — D1801 Hemangioma of skin and subcutaneous tissue: Secondary | ICD-10-CM | POA: Diagnosis not present

## 2024-02-25 DIAGNOSIS — D510 Vitamin B12 deficiency anemia due to intrinsic factor deficiency: Secondary | ICD-10-CM | POA: Diagnosis not present

## 2024-05-10 DIAGNOSIS — H0100A Unspecified blepharitis right eye, upper and lower eyelids: Secondary | ICD-10-CM | POA: Diagnosis not present

## 2024-05-10 DIAGNOSIS — H0100B Unspecified blepharitis left eye, upper and lower eyelids: Secondary | ICD-10-CM | POA: Diagnosis not present

## 2024-05-11 DIAGNOSIS — L578 Other skin changes due to chronic exposure to nonionizing radiation: Secondary | ICD-10-CM | POA: Diagnosis not present

## 2024-05-11 DIAGNOSIS — Z85828 Personal history of other malignant neoplasm of skin: Secondary | ICD-10-CM | POA: Diagnosis not present

## 2024-05-11 DIAGNOSIS — L821 Other seborrheic keratosis: Secondary | ICD-10-CM | POA: Diagnosis not present
